# Patient Record
Sex: Female | Born: 1985
Health system: Southern US, Community
[De-identification: ages and names within clinical notes are randomized; demographics above are authoritative.]

## PROBLEM LIST (undated history)

## (undated) DIAGNOSIS — F419 Anxiety disorder, unspecified: Secondary | ICD-10-CM

---

## 2019-10-16 ENCOUNTER — Encounter (HOSPITAL_COMMUNITY): Payer: Self-pay

## 2019-10-16 ENCOUNTER — Emergency Department (HOSPITAL_COMMUNITY)
Admission: EM | Admit: 2019-10-16 | Discharge: 2019-10-17 | Disposition: A | Discharge status: Home / Self Care | Payer: Self-pay | Attending: Emergency Medicine | Admitting: Emergency Medicine

## 2019-10-16 DIAGNOSIS — S61452A Open bite of left hand, initial encounter: Secondary | ICD-10-CM | POA: Insufficient documentation

## 2019-10-16 DIAGNOSIS — W540XXA Bitten by dog, initial encounter: Secondary | ICD-10-CM | POA: Insufficient documentation

## 2019-10-16 DIAGNOSIS — Z91040 Latex allergy status: Secondary | ICD-10-CM | POA: Insufficient documentation

## 2019-10-16 DIAGNOSIS — F17200 Nicotine dependence, unspecified, uncomplicated: Secondary | ICD-10-CM | POA: Insufficient documentation

## 2019-10-16 MED ORDER — METRONIDAZOLE 500 MG PO TABS
500.0000 mg | ORAL_TABLET | Freq: Once | ORAL | Status: AC
  Administered 2019-10-17: 500 mg via ORAL
  Filled 2019-10-16: qty 1

## 2019-10-16 MED ORDER — MORPHINE SULFATE (PF) 4 MG/ML IV SOLN
4.0000 mg | Freq: Once | INTRAVENOUS | Status: AC
  Administered 2019-10-17: 4 mg via INTRAVENOUS
  Filled 2019-10-16: qty 1

## 2019-10-16 MED ORDER — DOXYCYCLINE HYCLATE 100 MG PO TABS
100.0000 mg | ORAL_TABLET | Freq: Once | ORAL | Status: AC
  Administered 2019-10-17: 100 mg via ORAL
  Filled 2019-10-16: qty 1

## 2019-10-16 NOTE — ED Provider Notes (Signed)
[facility] EMERGENCY DEPARTMENT Provider Note   CSN: [id number] Arrival date & time: 10/16/19  2329     History Chief Complaint  Patient presents with  . Anxiety  . Animal Bite    Christine Morton is a 34 y.o. female.  The history is provided by the patient and medical records.  Anxiety  Animal Bite   34 year old female with history of severe anxiety, presenting to the ED after a dog bite.  States she moved to [place] from [place] about 1 year ago and is living in a new apartment.  States tonight the fire alarm went off and her service animal seem to be trying to guide her out of the building but latched onto her thumb and accidentally bit her.  Dog was not agitated and was not acting aggressively.  States she has had this service animal for quite some time.  His vaccinations just lapsed in the past few weeks.  States she did try to take him to the vet, however there were multiple people in the lobby and due to her anxiety she had to leave.  States all vaccinations prior to that are current.  Her tetanus is up-to-date.  Dog is being taken into custody by animal control for monitoring.  No past medical history on file.  There are no problems to display for this patient.    OB History   No obstetric history on file.     No family history on file.  Social History   Tobacco Use  . Smoking status: Current Every Day Smoker    Packs/day: 2.00  Vaping Use  . Vaping Use: Never used  Substance Use Topics  . Alcohol use: Not Currently  . Drug use: Not on file    Home Medications Prior to Admission medications   Not on File    Allergies    Latex, Shellfish allergy, and Penicillins  Review of Systems   Review of Systems  Skin: Positive for wound.  All other systems reviewed and are negative.   Physical Exam Updated Vital Signs BP 115/87   Ht 5\' 1"  (1.549 m)   Wt 74.8 kg   LMP  (LMP Unknown) Comment: on depo   BMI 31.18 kg/m    Physical Exam Vitals and nursing note reviewed.  Constitutional:      Appearance: She is well-developed.  HENT:     Head: Normocephalic and atraumatic.  Eyes:     Conjunctiva/sclera: Conjunctivae normal.     Pupils: Pupils are equal, round, and reactive to light.  Cardiovascular:     Rate and Rhythm: Normal rate and regular rhythm.     Heart sounds: Normal heart sounds.  Pulmonary:     Effort: Pulmonary effort is normal.     Breath sounds: Normal breath sounds.  Abdominal:     General: Bowel sounds are normal.     Palpations: Abdomen is soft.  Musculoskeletal:        General: Normal range of motion.     Cervical back: Normal range of motion.     Comments: Left hand with several puncture wounds noted to left thumb, there is small, very superficial laceration noted to the IP joint of left thumb along the dorsal aspect, bleeding is well controlled, exam is difficult due to patient cooperation/anxiety but she appears to have normal range of motion of all fingers  Skin:    General: Skin is warm and dry.  Neurological:     Mental Status:  She is alert and oriented to person, place, and time.  Psychiatric:        Mood and Affect: Mood is anxious.     Comments: Extremely anxious, intermittently sobbing throughout entire exam     ED Results / Procedures / Treatments   Labs (all labs ordered are listed, but only abnormal results are displayed) Labs Reviewed - No data to display  EKG None  Radiology DG Hand Complete Left  Result Date: 10/17/2019 CLINICAL DATA:  Dog bite, multiple puncture wound EXAM: LEFT HAND - COMPLETE 3+ VIEW COMPARISON:  None. FINDINGS: There is no evidence of fracture or dislocation. There is no evidence of arthropathy or other focal bone abnormality. Soft tissues are unremarkable. IMPRESSION: Negative. Electronically Signed   By: Helyn Numbers MD   On: 10/17/2019 00:26    Procedures Procedures (including critical care time)  Medications Ordered in  ED Medications  morphine 4 MG/ML injection 4 mg (4 mg Intravenous Given 10/17/19 0012)  doxycycline (VIBRA-TABS) tablet 100 mg (100 mg Oral Given 10/17/19 0011)  metroNIDAZOLE (FLAGYL) tablet 500 mg (500 mg Oral Given 10/17/19 0011)    ED Course  I have reviewed the triage vital signs and the nursing notes.  Pertinent labs & imaging results that were available during my care of the patient were reviewed by me and considered in my medical decision making (see chart for details).    MDM Rules/Calculators/A&P  34 year old female presenting to the ED with dog bite to left hand from service dog after fire alarm went off in their apartment building.  She has a history of severe anxiety and is extremely anxious here in the ED, sobbing intermittently.  She does have multiple puncture wounds to the left thumb and a superficial laceration along the IP joint dorsal surface.  Bleeding is well controlled.  Her tetanus is up-to-date.  States dog is due for updated shots, but otherwise has always been up-to-date.  Dog apparently is being quarantined by animal control.  Will plan for x-ray of the hand, wound care, and dose of antibiotics, will be given Doxy and Flagyl as she does have penicillin allergy.  X-rays negative.  Wound cleansed and dressed.  Plan to discharge home with continued antibiotics and wound care instructions.  As dog is quarantined, will hold off on rabies vaccination series at this time unless any signs or symptoms suggestive of rabies from the animal.  Patient acknowledged understanding and agrees with plan of care.  She will follow-up with her primary care doctor.  She may return here for any new or acute changes.  Final Clinical Impression(s) / ED Diagnoses Final diagnoses:  Dog bite, initial encounter    Rx / DC Orders ED Discharge Orders         Ordered    doxycycline (VIBRAMYCIN) 100 MG capsule  2 times daily        10/17/19 0157    metroNIDAZOLE (FLAGYL) 500 MG tablet  2  times daily        10/17/19 0157    HYDROcodone-acetaminophen (NORCO/VICODIN) 5-325 MG tablet  Every 4 hours PRN        10/17/19 0157           Garlon Hatchet, PA-C 10/17/19 0224    Marily Memos, MD 10/17/19 [phone]

## 2019-10-16 NOTE — ED Triage Notes (Addendum)
Pt BIB EMS [place] , with c/o dog bite to left hand, multiple puncture wounds distal and proximal , by pt's service dog, dog is due now for follow vaccinations.   Pt has been unmedicated for extreme anxiety , EMS gave versed 5mg  IM and 50 mcg fentanyl 30 minutes ago   20 L. AC, VSS , ST, hemorrhage controlled with simple bandages, GCS 15 , 12 on seen d/t hyperventilation.

## 2019-10-17 ENCOUNTER — Other Ambulatory Visit (HOSPITAL_COMMUNITY): Payer: Self-pay | Admitting: Emergency Medicine

## 2019-10-17 ENCOUNTER — Emergency Department (HOSPITAL_COMMUNITY): Payer: Self-pay

## 2019-10-17 ENCOUNTER — Telehealth: Payer: Self-pay | Admitting: *Deleted

## 2019-10-17 MED ORDER — DOXYCYCLINE HYCLATE 100 MG PO CAPS: 100.0000 mg | ORAL_CAPSULE | Freq: Two times a day (BID) | ORAL | 0 refills | Status: DC

## 2019-10-17 MED ORDER — METRONIDAZOLE 500 MG PO TABS: 500.0000 mg | ORAL_TABLET | Freq: Two times a day (BID) | ORAL | 0 refills | Status: DC

## 2019-10-17 MED ORDER — HYDROCODONE-ACETAMINOPHEN 5-325 MG PO TABS: 1.0000 | ORAL_TABLET | ORAL | 0 refills | Status: DC | PRN

## 2019-10-17 MED FILL — DOXYCYCLINE HYCLATE 100 MG: 100 | 10 days supply | Qty: 20 | Fill #0

## 2019-10-17 MED FILL — metroNIDAZOLE 500 MG TABS: 500 | 10 days supply | Qty: 20 | Fill #0

## 2019-10-17 MED FILL — HYDROCODON-APAP 5-325: 5-325 | 2 days supply | Qty: 12 | Fill #0

## 2019-10-17 NOTE — Discharge Instructions (Signed)
Keep wound clean with soap and warm water daily. Take the prescribed medication as directed.  Make sure to finish the whole course of antibiotics. Dog will need to be monitored for at least 10 days to ensure no signs of rabies.  If so, you will need to get vaccinated for this. Follow-up with your primary care doctor. Return to the ED for new or worsening symptoms.

## 2019-10-17 NOTE — Telephone Encounter (Signed)
Pharmacy called regarding reasoning for printed Rx (narcotic).  RNCM reviewed chart and did not find documentation, so sent message to PA.  Will update pharmacy when information come available.

## 2020-01-06 ENCOUNTER — Encounter (HOSPITAL_COMMUNITY): Payer: Self-pay | Admitting: Emergency Medicine

## 2020-01-06 ENCOUNTER — Emergency Department (HOSPITAL_COMMUNITY)
Admission: EM | Admit: 2020-01-06 | Discharge: 2020-01-06 | Disposition: A | Discharge status: Home / Self Care | Payer: Medicaid Other | Attending: Emergency Medicine | Admitting: Emergency Medicine

## 2020-01-06 ENCOUNTER — Emergency Department (HOSPITAL_COMMUNITY): Payer: Medicaid Other

## 2020-01-06 ENCOUNTER — Other Ambulatory Visit: Payer: Self-pay

## 2020-01-06 DIAGNOSIS — R112 Nausea with vomiting, unspecified: Secondary | ICD-10-CM | POA: Diagnosis not present

## 2020-01-06 DIAGNOSIS — R109 Unspecified abdominal pain: Secondary | ICD-10-CM | POA: Insufficient documentation

## 2020-01-06 DIAGNOSIS — Z20822 Contact with and (suspected) exposure to covid-19: Secondary | ICD-10-CM | POA: Diagnosis not present

## 2020-01-06 DIAGNOSIS — R000 Tachycardia, unspecified: Secondary | ICD-10-CM | POA: Insufficient documentation

## 2020-01-06 DIAGNOSIS — Z91040 Latex allergy status: Secondary | ICD-10-CM | POA: Insufficient documentation

## 2020-01-06 DIAGNOSIS — L0501 Pilonidal cyst with abscess: Secondary | ICD-10-CM | POA: Insufficient documentation

## 2020-01-06 DIAGNOSIS — F17200 Nicotine dependence, unspecified, uncomplicated: Secondary | ICD-10-CM | POA: Diagnosis not present

## 2020-01-06 DIAGNOSIS — L0291 Cutaneous abscess, unspecified: Secondary | ICD-10-CM

## 2020-01-06 DIAGNOSIS — L0591 Pilonidal cyst without abscess: Secondary | ICD-10-CM | POA: Diagnosis present

## 2020-01-06 HISTORY — DX: Anxiety disorder, unspecified: F41.9

## 2020-01-06 LAB — CBC WITH DIFFERENTIAL/PLATELET
Abs Immature Granulocytes: 0.03 10*3/uL (ref 0.00–0.07)
Basophils Absolute: 0 10*3/uL (ref 0.0–0.1)
Basophils Relative: 0 %
Eosinophils Absolute: 0.1 10*3/uL (ref 0.0–0.5)
Eosinophils Relative: 1 %
HCT: 41.5 % (ref 36.0–46.0)
Hemoglobin: 13.7 g/dL (ref 12.0–15.0)
Immature Granulocytes: 0 %
Lymphocytes Relative: 28 %
Lymphs Abs: 2.8 10*3/uL (ref 0.7–4.0)
MCH: 29.2 pg (ref 26.0–34.0)
MCHC: 33 g/dL (ref 30.0–36.0)
MCV: 88.5 fL (ref 80.0–100.0)
Monocytes Absolute: 0.7 10*3/uL (ref 0.1–1.0)
Monocytes Relative: 7 %
Neutro Abs: 6.6 10*3/uL (ref 1.7–7.7)
Neutrophils Relative %: 64 %
Platelets: 484 10*3/uL — ABNORMAL HIGH (ref 150–400)
RBC: 4.69 MIL/uL (ref 3.87–5.11)
RDW: 12.1 % (ref 11.5–15.5)
WBC: 10.2 10*3/uL (ref 4.0–10.5)
nRBC: 0 % (ref 0.0–0.2)

## 2020-01-06 LAB — RESP PANEL BY RT-PCR (FLU A&B, COVID) ARPGX2
Influenza A by PCR: NEGATIVE
Influenza B by PCR: NEGATIVE
SARS Coronavirus 2 by RT PCR: NEGATIVE

## 2020-01-06 LAB — COMPREHENSIVE METABOLIC PANEL
ALT: 13 U/L (ref 0–44)
AST: 15 U/L (ref 15–41)
Albumin: 4 g/dL (ref 3.5–5.0)
Alkaline Phosphatase: 74 U/L (ref 38–126)
Anion gap: 11 (ref 5–15)
BUN: 6 mg/dL (ref 6–20)
CO2: 23 mmol/L (ref 22–32)
Calcium: 9.3 mg/dL (ref 8.9–10.3)
Chloride: 101 mmol/L (ref 98–111)
Creatinine, Ser: 0.92 mg/dL (ref 0.44–1.00)
GFR, Estimated: >60 mL/min (ref >60)
Glucose, Bld: 139 mg/dL — ABNORMAL HIGH (ref 70–99)
Potassium: 3.2 mmol/L — ABNORMAL LOW (ref 3.5–5.1)
Sodium: 135 mmol/L (ref 135–145)
Total Bilirubin: 1.2 mg/dL (ref 0.3–1.2)
Total Protein: 8.4 g/dL — ABNORMAL HIGH (ref 6.5–8.1)

## 2020-01-06 LAB — URINALYSIS, ROUTINE W REFLEX MICROSCOPIC
Bilirubin Urine: NEGATIVE
Glucose, UA: NEGATIVE mg/dL
Ketones, ur: NEGATIVE mg/dL
Leukocytes,Ua: NEGATIVE
Nitrite: NEGATIVE
Protein, ur: NEGATIVE mg/dL
Specific Gravity, Urine: 1.015 (ref 1.005–1.030)
pH: 5 (ref 5.0–8.0)

## 2020-01-06 LAB — LACTIC ACID, PLASMA
Lactic Acid, Venous: 1.8 mmol/L (ref 0.5–1.9)
Lactic Acid, Venous: 0.7 mmol/L (ref 0.5–1.9)

## 2020-01-06 LAB — I-STAT BETA HCG BLOOD, ED (MC, WL, AP ONLY): I-stat hCG, quantitative: <5 m[IU]/mL (ref <5)

## 2020-01-06 MED ORDER — LIDOCAINE-EPINEPHRINE 1 %-1:100000 IJ SOLN
10.0000 mL | Freq: Once | INTRAMUSCULAR | Status: DC
  Filled 2020-01-06: qty 1

## 2020-01-06 MED ORDER — ONDANSETRON 4 MG PO TBDP: 4.0000 mg | ORAL_TABLET | Freq: Three times a day (TID) | ORAL | 0 refills | Status: DC | PRN

## 2020-01-06 MED ORDER — CLINDAMYCIN HCL 150 MG PO CAPS: 450.0000 mg | ORAL_CAPSULE | Freq: Three times a day (TID) | ORAL | 0 refills | Status: AC

## 2020-01-06 MED ORDER — IOHEXOL 300 MG/ML  SOLN
100.0000 mL | Freq: Once | INTRAMUSCULAR | Status: AC | PRN
  Administered 2020-01-06: 100 mL via INTRAVENOUS

## 2020-01-06 MED ORDER — SODIUM CHLORIDE 0.9 % IV BOLUS
1000.0000 mL | Freq: Once | INTRAVENOUS | Status: AC
  Administered 2020-01-06: 1000 mL via INTRAVENOUS

## 2020-01-06 MED ORDER — ONDANSETRON HCL 4 MG/2ML IJ SOLN
4.0000 mg | Freq: Once | INTRAMUSCULAR | Status: AC
  Administered 2020-01-06: 4 mg via INTRAVENOUS
  Filled 2020-01-06: qty 2

## 2020-01-06 MED ORDER — KETOROLAC TROMETHAMINE 30 MG/ML IJ SOLN
15.0000 mg | Freq: Once | INTRAMUSCULAR | Status: AC
  Administered 2020-01-06: 15 mg via INTRAVENOUS
  Filled 2020-01-06: qty 1

## 2020-01-06 MED ORDER — ACETAMINOPHEN 500 MG PO TABS
1000.0000 mg | ORAL_TABLET | Freq: Once | ORAL | Status: AC
  Administered 2020-01-06: 1000 mg via ORAL
  Filled 2020-01-06: qty 2

## 2020-01-06 MED ORDER — POTASSIUM CHLORIDE CRYS ER 20 MEQ PO TBCR
40.0000 meq | EXTENDED_RELEASE_TABLET | Freq: Once | ORAL | Status: AC
  Administered 2020-01-06: 40 meq via ORAL
  Filled 2020-01-06: qty 2

## 2020-01-06 NOTE — ED Triage Notes (Signed)
C/o abscess to buttocks x 3 days with fever and chills.  HR and respirations elevated.

## 2020-01-06 NOTE — ED Notes (Signed)
Christine Morton(mom) would like an update ASAP [phone]

## 2020-01-06 NOTE — Discharge Instructions (Signed)
I prescribed you 2 medications.  First medication is called clindamycin.  This is a strong antibiotic.  You are going to take this 3 times a day for the next 7 days.  Do not stop taking this early.  Only take it as prescribed.  I am also prescribing you a medication called Zofran.  You can take this for worsening nausea and vomiting.  Only take this if you are experiencing nausea and vomiting that you cannot control or is causing you to become dehydrated.  If you develop fevers, chills, or continued nausea and vomiting you cannot control, please return to the emergency department for reevaluation.  It was a pleasure to meet you.

## 2020-01-06 NOTE — ED Provider Notes (Signed)
[facility] EMERGENCY DEPARTMENT Provider Note   CSN: [id number] Arrival date & time: 01/06/20  1302     History Chief Complaint  Patient presents with  . Abscess    Christine Morton is a 35 y.o. female.  HPI Pt is a 35 year old female who presents emergency department due to an abscess.  Patient notes pain and swelling overlying her coccyx that began about 4 days ago.  She reports associated fevers and chills.  Also complains of intermittent nausea and vomiting.  Notes a history of similar symptoms in the past but never of the severity or in this region.  No chest pain, shortness of breath, urinary changes.    Past Medical History:  Diagnosis Date  . Anxiety     There are no problems to display for this patient.   History reviewed. No pertinent surgical history.   OB History   No obstetric history on file.     No family history on file.  Social History   Tobacco Use  . Smoking status: Current Every Day Smoker    Packs/day: 2.00  Vaping Use  . Vaping Use: Never used  Substance Use Topics  . Alcohol use: Not Currently    Home Medications Prior to Admission medications   Medication Sig Start Date End Date Taking? Authorizing Provider  doxycycline (VIBRAMYCIN) 100 MG capsule Take 1 capsule (100 mg total) by mouth 2 (two) times daily. 10/17/19   Garlon Hatchet, PA-C  HYDROcodone-acetaminophen (NORCO/VICODIN) 5-325 MG tablet Take 1 tablet by mouth every 4 (four) hours as needed. 10/17/19   Garlon Hatchet, PA-C  metroNIDAZOLE (FLAGYL) 500 MG tablet Take 1 tablet (500 mg total) by mouth 2 (two) times daily. 10/17/19   Garlon Hatchet, PA-C    Allergies    Latex, Shellfish allergy, and Penicillins  Review of Systems   Review of Systems  All other systems reviewed and are negative. Ten systems reviewed and are negative for acute change, except as noted in the HPI.   Physical Exam Updated Vital Signs BP (!) 113/59 (BP Location: Left Arm)    Pulse (!) 107   Temp 99.1 F (37.3 C) (Oral)   Resp 18   SpO2 100%   Physical Exam Vitals and nursing note reviewed.  Constitutional:      General: She is not in acute distress.    Appearance: Normal appearance. She is obese. She is not ill-appearing, toxic-appearing or diaphoretic.     Comments: Well-developed adult female.  Lying in the semi-Fowlers position.  Writhing around on stretcher.  HENT:     Head: Normocephalic and atraumatic.     Right Ear: External ear normal.     Left Ear: External ear normal.     Nose: Nose normal.     Mouth/Throat:     Mouth: Mucous membranes are moist.     Pharynx: Oropharynx is clear. No oropharyngeal exudate or posterior oropharyngeal erythema.  Eyes:     Extraocular Movements: Extraocular movements intact.  Cardiovascular:     Rate and Rhythm: Regular rhythm. Tachycardia present.     Pulses: Normal pulses.     Heart sounds: Normal heart sounds. No murmur heard. No friction rub. No gallop.   Pulmonary:     Effort: Pulmonary effort is normal. No respiratory distress.     Breath sounds: Normal breath sounds. No stridor. No wheezing, rhonchi or rales.  Abdominal:     General: Abdomen is flat.     Palpations: Abdomen is  soft.     Tenderness: There is abdominal tenderness.     Comments: Diffuse tenderness noted along the lower abdomen with deep palpation.  No upper abdominal tenderness.  Musculoskeletal:        General: Normal range of motion.     Cervical back: Normal range of motion and neck supple. No tenderness.  Skin:    General: Skin is warm and dry.     Findings: Abscess present.     Comments: 3 to 4 cm of induration noted along the coccyx with overlying fluctuance.  No active drainage.  Exquisite tenderness overlying the site.  Small amount of surrounding cellulitis.  Consistent with abscess.  Neurological:     General: No focal deficit present.     Mental Status: She is alert and oriented to person, place, and time.  Psychiatric:         Mood and Affect: Mood normal.        Behavior: Behavior normal.    ED Results / Procedures / Treatments   Labs (all labs ordered are listed, but only abnormal results are displayed) Labs Reviewed  COMPREHENSIVE METABOLIC PANEL - Abnormal; Notable for the following components:      Result Value   Potassium 3.2 (*)    Glucose, Bld 139 (*)    Total Protein 8.4 (*)    All other components within normal limits  CBC WITH DIFFERENTIAL/PLATELET - Abnormal; Notable for the following components:   Platelets 484 (*)    All other components within normal limits  URINALYSIS, ROUTINE W REFLEX MICROSCOPIC - Abnormal; Notable for the following components:   APPearance HAZY (*)    Hgb urine dipstick MODERATE (*)    Bacteria, UA RARE (*)    All other components within normal limits  RESP PANEL BY RT-PCR (FLU A&B, COVID) ARPGX2  LACTIC ACID, PLASMA  LACTIC ACID, PLASMA  I-STAT BETA HCG BLOOD, ED ([facility], [facility], [facility] ONLY)    EKG EKG Interpretation  Date/Time:  Sunday January 06 2020 13:26:52 EST Ventricular Rate:  157 PR Interval:    QRS Duration: 66 QT Interval:  302 QTC Calculation: 488 R Axis:   68 Text Interpretation: Sinus tachycardia ST & T wave abnormality, consider inferior ischemia Abnormal ECG no acute STEMI Confirmed by Madalyn Rob [phone]) on 01/06/2020 3:16:58 PM   Radiology CT ABDOMEN PELVIS W CONTRAST  Result Date: 01/06/2020 CLINICAL DATA:  35 year old female with history of acute nonlocalized abdominal pain. Abscess in the buttock region for the past 3 days. Fever and chills. EXAM: CT ABDOMEN AND PELVIS WITH CONTRAST TECHNIQUE: Multidetector CT imaging of the abdomen and pelvis was performed using the standard protocol following bolus administration of intravenous contrast. CONTRAST:  184mL OMNIPAQUE IOHEXOL 300 MG/ML  SOLN COMPARISON:  No priors. FINDINGS: Lower chest: Unremarkable. Hepatobiliary: No suspicious cystic or solid hepatic lesions. No intra or extrahepatic biliary  ductal dilatation. Gallbladder is normal in appearance. Pancreas: No pancreatic mass. No pancreatic ductal dilatation. No pancreatic or peripancreatic fluid collections or inflammatory changes. Spleen: Unremarkable. Adrenals/Urinary Tract: Bilateral kidneys and bilateral adrenal glands are normal in appearance. No hydroureteronephrosis. Urinary bladder is nearly decompressed, but otherwise unremarkable in appearance. Stomach/Bowel: The appearance of the stomach is normal. No pathologic dilatation of small bowel or colon. Normal appendix. Vascular/Lymphatic: No significant atherosclerotic disease, aneurysm or dissection noted in the abdominal or pelvic vasculature. No lymphadenopathy noted in the abdomen or pelvis. Reproductive: No significant atherosclerotic disease, aneurysm or dissection noted in the abdominal or pelvic vasculature. No  lymphadenopathy noted in the abdomen or pelvis. Other: No significant volume of ascites.  No pneumoperitoneum. Musculoskeletal: Extensive phlegmonous inflammation is noted in the buttock region in near the midline where there is also a more well-defined centrally low-attenuation peripherally rim enhancing collection measuring approximately 2.2 x 2.2 x 2.8 cm (axial image 63 of series 3 and sagittal image 125 of series 7, concerning for a small abscess. There are no aggressive appearing lytic or blastic lesions noted in the visualized portions of the skeleton. IMPRESSION: 1. Small abscess with surrounding phlegmonous inflammation in the midline of the upper gluteal region, as above. 2. No other acute findings are noted elsewhere in the abdomen or pelvis. Electronically Signed   By: Trudie Reed M.D.   On: 01/06/2020 18:25    Procedures .Marland KitchenIncision and Drainage  Date/Time: 01/06/2020 8:15 PM Performed by: Placido Sou, PA-C Authorized by: Placido Sou, PA-C   Consent:    Consent obtained:  Verbal   Consent given by:  Patient   Risks discussed:  Bleeding,  incomplete drainage, pain and damage to other organs   Alternatives discussed:  No treatment Universal protocol:    Procedure explained and questions answered to patient or proxy's satisfaction: yes     Relevant documents present and verified: yes     Test results available : yes     Imaging studies available: yes     Required blood products, implants, devices, and special equipment available: yes     Site/side marked: yes     Immediately prior to procedure, a time out was called: yes     Patient identity confirmed:  Verbally with patient Location:    Type:  Abscess   Size:  4 cm   Location:  Anogenital   Anogenital location:  Pilonidal Pre-procedure details:    Skin preparation:  Chlorhexidine with alcohol Anesthesia:    Anesthesia method:  Local infiltration   Local anesthetic:  Lidocaine 1% WITH epi and lidocaine 2% WITH epi Procedure type:    Complexity:  Complex Procedure details:    Ultrasound guidance: no     Needle aspiration: no     Incision types:  Single straight   Incision depth:  Subcutaneous   Scalpel blade:  11   Wound management:  Probed and deloculated and extensive cleaning   Drainage:  Purulent and bloody   Drainage amount:  Moderate   Wound treatment:  Wound left open   Packing materials:  None Post-procedure details:    Procedure completion:  Tolerated well, no immediate complications    Medications Ordered in ED Medications  lidocaine-EPINEPHrine (XYLOCAINE W/EPI) 1 %-1:100000 (with pres) injection 10 mL (has no administration in time range)  sodium chloride 0.9 % bolus 1,000 mL (0 mLs Intravenous Stopped 01/06/20 1756)  ondansetron (ZOFRAN) injection 4 mg (4 mg Intravenous Given 01/06/20 1553)  acetaminophen (TYLENOL) tablet 1,000 mg (1,000 mg Oral Given 01/06/20 1555)  potassium chloride SA (KLOR-CON) CR tablet 40 mEq (40 mEq Oral Given 01/06/20 1555)  iohexol (OMNIPAQUE) 300 MG/ML solution 100 mL (100 mLs Intravenous Contrast Given 01/06/20 1732)   ondansetron (ZOFRAN) injection 4 mg (4 mg Intravenous Given 01/06/20 1938)  ketorolac (TORADOL) 30 MG/ML injection 15 mg (15 mg Intravenous Given 01/06/20 1940)    ED Course  I have reviewed the triage vital signs and the nursing notes.  Pertinent labs & imaging results that were available during my care of the patient were reviewed by me and considered in my medical decision making (see chart for  details).  Clinical Course as of 01/06/20 2016  Sun Jan 06, 2020  1830 CT ABDOMEN PELVIS W CONTRAST IMPRESSION: 1. Small abscess with surrounding phlegmonous inflammation in the midline of the upper gluteal region, as above. 2. No other acute findings are noted elsewhere in the abdomen or pelvis. [LJ]    Clinical Course User Index [LJ] Placido Sou, PA-C   MDM Rules/Calculators/A&P                          Patient is a 35 year old female who presents the emergency department with what appears to be a pilonidal abscess.  Patient had a mildly elevated temperature at 99.3 F and was tachycardic in triage.  Her tachycardia has since resolved.  She has had multiple doses of analgesics as well as antiemetics.  CBC without leukocytosis.  Mildly decreased potassium.  This was repleted with Klor-Con.  No elevation in lactic acid x2.  Given her systemic complaints, I obtained a COVID-19 test which was negative.  CT scan of the abdomen and pelvis obtained with findings as noted below which was personally reviewed and interpreted:  IMPRESSION: 1. Small abscess with surrounding phlegmonous inflammation in the midline of the upper gluteal region, as above. 2. No other acute findings are noted elsewhere in the abdomen or pelvis.  I performed an I&D on the region.  Please see my procedure note above.  We will discharge on a course of clindamycin as well as additional Zofran for nausea.  Discussed return precautions.  Her questions were answered and she was amicable at the time of discharge.  Her vital signs  are stable.  Final Clinical Impression(s) / ED Diagnoses Final diagnoses:  Abscess    Rx / DC Orders ED Discharge Orders         Ordered    clindamycin (CLEOCIN) 150 MG capsule  3 times daily        01/06/20 2021    ondansetron (ZOFRAN ODT) 4 MG disintegrating tablet  Every 8 hours PRN        01/06/20 2021           Placido Sou, PA-C 01/06/20 2024    Milagros Loll, MD 01/11/20 1234

## 2020-01-06 NOTE — ED Notes (Signed)
Pt to CT

## 2020-01-06 NOTE — ED Notes (Signed)
Pt returned to room from CT scan

## 2020-03-25 ENCOUNTER — Encounter (HOSPITAL_COMMUNITY)
Admission: EM | Disposition: A | Discharge status: Home / Self Care | Payer: Self-pay | Source: Home / Self Care | Attending: Orthopedic Surgery

## 2020-03-25 ENCOUNTER — Encounter (HOSPITAL_COMMUNITY): Payer: Self-pay

## 2020-03-25 ENCOUNTER — Inpatient Hospital Stay (HOSPITAL_COMMUNITY)
Admission: EM | Admit: 2020-03-25 | Discharge: 2020-03-28 | DRG: 517 | Disposition: A | Discharge status: Home / Self Care | Payer: Medicaid Other | Attending: Orthopedic Surgery | Admitting: Orthopedic Surgery

## 2020-03-25 ENCOUNTER — Emergency Department (HOSPITAL_COMMUNITY): Payer: Medicaid Other

## 2020-03-25 ENCOUNTER — Other Ambulatory Visit: Payer: Self-pay

## 2020-03-25 DIAGNOSIS — F17210 Nicotine dependence, cigarettes, uncomplicated: Secondary | ICD-10-CM | POA: Diagnosis present

## 2020-03-25 DIAGNOSIS — Z880 Allergy status to penicillin: Secondary | ICD-10-CM

## 2020-03-25 DIAGNOSIS — Z20822 Contact with and (suspected) exposure to covid-19: Secondary | ICD-10-CM | POA: Diagnosis present

## 2020-03-25 DIAGNOSIS — S61451A Open bite of right hand, initial encounter: Secondary | ICD-10-CM

## 2020-03-25 DIAGNOSIS — S62652B Nondisplaced fracture of medial phalanx of right middle finger, initial encounter for open fracture: Principal | ICD-10-CM | POA: Diagnosis present

## 2020-03-25 DIAGNOSIS — F419 Anxiety disorder, unspecified: Secondary | ICD-10-CM | POA: Diagnosis present

## 2020-03-25 DIAGNOSIS — Z91040 Latex allergy status: Secondary | ICD-10-CM

## 2020-03-25 DIAGNOSIS — Z79899 Other long term (current) drug therapy: Secondary | ICD-10-CM

## 2020-03-25 DIAGNOSIS — W540XXA Bitten by dog, initial encounter: Secondary | ICD-10-CM | POA: Diagnosis present

## 2020-03-25 DIAGNOSIS — Z91013 Allergy to seafood: Secondary | ICD-10-CM

## 2020-03-25 HISTORY — PX: I & D EXTREMITY: SHX5045

## 2020-03-25 LAB — CBC WITH DIFFERENTIAL/PLATELET
Abs Immature Granulocytes: 0.01 10*3/uL (ref 0.00–0.07)
Basophils Absolute: 0 10*3/uL (ref 0.0–0.1)
Basophils Relative: 0 %
Eosinophils Absolute: 0 10*3/uL (ref 0.0–0.5)
Eosinophils Relative: 1 %
HCT: 38.5 % (ref 36.0–46.0)
Hemoglobin: 12.3 g/dL (ref 12.0–15.0)
Immature Granulocytes: 0 %
Lymphocytes Relative: 35 %
Lymphs Abs: 2.6 10*3/uL (ref 0.7–4.0)
MCH: 28.3 pg (ref 26.0–34.0)
MCHC: 31.9 g/dL (ref 30.0–36.0)
MCV: 88.5 fL (ref 80.0–100.0)
Monocytes Absolute: 0.5 10*3/uL (ref 0.1–1.0)
Monocytes Relative: 7 %
Neutro Abs: 4.2 10*3/uL (ref 1.7–7.7)
Neutrophils Relative %: 57 %
Platelets: 307 10*3/uL (ref 150–400)
RBC: 4.35 MIL/uL (ref 3.87–5.11)
RDW: 11.9 % (ref 11.5–15.5)
WBC: 7.4 10*3/uL (ref 4.0–10.5)
nRBC: 0 % (ref 0.0–0.2)

## 2020-03-25 LAB — BASIC METABOLIC PANEL
Anion gap: 9 (ref 5–15)
BUN: 5 mg/dL — ABNORMAL LOW (ref 6–20)
CO2: 25 mmol/L (ref 22–32)
Calcium: 9.5 mg/dL (ref 8.9–10.3)
Chloride: 106 mmol/L (ref 98–111)
Creatinine, Ser: 0.78 mg/dL (ref 0.44–1.00)
GFR, Estimated: >60 mL/min (ref >60)
Glucose, Bld: 88 mg/dL (ref 70–99)
Potassium: 3.4 mmol/L — ABNORMAL LOW (ref 3.5–5.1)
Sodium: 140 mmol/L (ref 135–145)

## 2020-03-25 LAB — RESP PANEL BY RT-PCR (FLU A&B, COVID) ARPGX2
Influenza A by PCR: NEGATIVE
Influenza B by PCR: NEGATIVE
SARS Coronavirus 2 by RT PCR: NEGATIVE

## 2020-03-25 LAB — I-STAT BETA HCG BLOOD, ED (MC, WL, AP ONLY): I-stat hCG, quantitative: <5 m[IU]/mL (ref <5)

## 2020-03-25 SURGERY — IRRIGATION AND DEBRIDEMENT EXTREMITY
Anesthesia: General | Laterality: Right

## 2020-03-25 MED ORDER — MIDAZOLAM HCL 2 MG/2ML IJ SOLN
INTRAMUSCULAR | Status: AC
  Filled 2020-03-25: qty 2

## 2020-03-25 MED ORDER — FENTANYL CITRATE (PF) 100 MCG/2ML IJ SOLN
25.0000 ug | Freq: Once | INTRAMUSCULAR | Status: AC
Start: 2020-03-25 — End: 2020-03-25
  Administered 2020-03-25: 25 ug via INTRAVENOUS
  Filled 2020-03-25: qty 2

## 2020-03-25 MED ORDER — BUPIVACAINE HCL (PF) 0.5 % IJ SOLN
INTRAMUSCULAR | Status: AC
  Filled 2020-03-25: qty 30

## 2020-03-25 MED ORDER — PROPOFOL 10 MG/ML IV BOLUS
INTRAVENOUS | Status: AC
  Filled 2020-03-25: qty 20

## 2020-03-25 MED ORDER — DOXYCYCLINE HYCLATE 100 MG PO TABS
100.0000 mg | ORAL_TABLET | Freq: Once | ORAL | Status: AC
  Administered 2020-03-25: 100 mg via ORAL
  Filled 2020-03-25: qty 1

## 2020-03-25 MED ORDER — BUPIVACAINE HCL (PF) 0.25 % IJ SOLN
INTRAMUSCULAR | Status: AC
  Filled 2020-03-25: qty 30

## 2020-03-25 MED ORDER — FENTANYL CITRATE (PF) 250 MCG/5ML IJ SOLN
INTRAMUSCULAR | Status: AC
  Filled 2020-03-25: qty 5

## 2020-03-25 MED ORDER — CLINDAMYCIN PHOSPHATE 600 MG/50ML IV SOLN
600.0000 mg | Freq: Once | INTRAVENOUS | Status: AC
  Administered 2020-03-25: 600 mg via INTRAVENOUS
  Filled 2020-03-25: qty 50

## 2020-03-25 MED ORDER — OXYCODONE-ACETAMINOPHEN 5-325 MG PO TABS
1.0000 | ORAL_TABLET | Freq: Once | ORAL | Status: AC
  Administered 2020-03-25: 1 via ORAL
  Filled 2020-03-25: qty 1

## 2020-03-25 SURGICAL SUPPLY — 44 items
BNDG COHESIVE 4X5 TAN STRL (GAUZE/BANDAGES/DRESSINGS) ×2 IMPLANT
BNDG CONFORM 4 STRL LF (GAUZE/BANDAGES/DRESSINGS) ×2 IMPLANT
BNDG ELASTIC 4X5.8 VLCR STR LF (GAUZE/BANDAGES/DRESSINGS) ×2 IMPLANT
BNDG ESMARK 4X9 LF (GAUZE/BANDAGES/DRESSINGS) ×2 IMPLANT
BNDG GAUZE ELAST 4 BULKY (GAUZE/BANDAGES/DRESSINGS) ×2 IMPLANT
CORD BIPOLAR FORCEPS 12FT (ELECTRODE) IMPLANT
COVER SURGICAL LIGHT HANDLE (MISCELLANEOUS) ×2 IMPLANT
COVER WAND RF STERILE (DRAPES) IMPLANT
CUFF TOURN SGL QUICK 18X4 (TOURNIQUET CUFF) ×2 IMPLANT
DRAIN PENROSE 0.25X18 (DRAIN) IMPLANT
DRSG EMULSION OIL 3X16 NADH (GAUZE/BANDAGES/DRESSINGS) ×2 IMPLANT
DRSG EMULSION OIL 3X3 NADH (GAUZE/BANDAGES/DRESSINGS) ×2 IMPLANT
DRSG PAD ABDOMINAL 8X10 ST (GAUZE/BANDAGES/DRESSINGS) IMPLANT
ELECT REM PT RETURN 15FT ADLT (MISCELLANEOUS) ×2 IMPLANT
GAUZE 4X4 16PLY RFD (DISPOSABLE) ×2 IMPLANT
GAUZE SPONGE 4X4 12PLY STRL (GAUZE/BANDAGES/DRESSINGS) ×2 IMPLANT
GAUZE XEROFORM 1X8 LF (GAUZE/BANDAGES/DRESSINGS) ×2 IMPLANT
GLOVE SURG ENC MOIS LTX SZ8 (GLOVE) ×2 IMPLANT
GOWN STRL REUS W/TWL XL LVL3 (GOWN DISPOSABLE) ×2 IMPLANT
KIT BASIN OR (CUSTOM PROCEDURE TRAY) ×2 IMPLANT
KIT TURNOVER KIT A (KITS) ×2 IMPLANT
LOOP VESSEL MAXI BLUE (MISCELLANEOUS) ×2 IMPLANT
MANIFOLD NEPTUNE II (INSTRUMENTS) ×2 IMPLANT
PACK ORTHO EXTREMITY (CUSTOM PROCEDURE TRAY) ×2 IMPLANT
PENCIL SMOKE EVACUATOR (MISCELLANEOUS) IMPLANT
PROTECTOR NERVE ULNAR (MISCELLANEOUS) ×2 IMPLANT
SOL PREP POV-IOD 4OZ 10% (MISCELLANEOUS) IMPLANT
SOL PREP PROV IODINE SCRUB 4OZ (MISCELLANEOUS) IMPLANT
SPLINT FINGER 5.25 BULB (SOFTGOODS) IMPLANT
SUT PROLENE 3 0 PS 2 (SUTURE) IMPLANT
SUT PROLENE 4 0 PS 2 18 (SUTURE) IMPLANT
SUT PROLENE 5 0 P 3 (SUTURE) ×2 IMPLANT
SUT VIC AB 1 CT1 27 (SUTURE)
SUT VIC AB 1 CT1 27XBRD ANTBC (SUTURE) IMPLANT
SUT VIC AB 2-0 CT1 27 (SUTURE)
SUT VIC AB 2-0 CT1 27XBRD (SUTURE) IMPLANT
SWAB COLLECTION DEVICE MRSA (MISCELLANEOUS) IMPLANT
SWAB CULTURE ESWAB REG 1ML (MISCELLANEOUS) IMPLANT
SYR 20ML LL LF (SYRINGE) ×2 IMPLANT
SYR CONTROL 10ML LL (SYRINGE) IMPLANT
TOWEL OR 17X26 10 PK STRL BLUE (TOWEL DISPOSABLE) ×2 IMPLANT
TRAY PREP A LATEX SAFE STRL (SET/KITS/TRAYS/PACK) ×2 IMPLANT
TUBING UROLOGY SET (TUBING) ×2 IMPLANT
YANKAUER SUCT BULB TIP NO VENT (SUCTIONS) ×2 IMPLANT

## 2020-03-25 NOTE — ED Triage Notes (Signed)
Pt arrives EMS from home after dog bite to right middle finger.. Puncture wound. Abrasions on left hand after falling while attempting to break up dogs. Anxious in triage.

## 2020-03-25 NOTE — ED Notes (Signed)
Right hand soaking in sterile water as ordered.

## 2020-03-25 NOTE — ED Provider Notes (Cosign Needed Addendum)
[facility] Provider Note   CSN: [id number] Arrival date & time: 03/25/20  1928     History Chief Complaint  Patient presents with  . Animal Bite    Christine Morton is a 35 y.o. female who presents to ED with a chief complaint of dog bite.  States that someone had came into her home and her dog became aggressive.  She tried to hold the dog back and ended up sustaining a dog bite to her right middle finger.  Reports that she fell and has had abrasions to her left hand and right knee as well.  She tried to clean the area out but had significant bleeding.  Denies any numbness, weakness.  States that her dogs are up-to-date on their vaccinations. This occurred around noon today.  HPI     Past Medical History:  Diagnosis Date  . Anxiety     There are no problems to display for this patient.   History reviewed. No pertinent surgical history.   OB History   No obstetric history on file.     No family history on file.  Social History   Tobacco Use  . Smoking status: Current Every Day Smoker    Packs/day: 2.00  . Smokeless tobacco: Never Used  Vaping Use  . Vaping Use: Never used  Substance Use Topics  . Alcohol use: Not Currently    Home Medications Prior to Admission medications   Medication Sig Start Date End Date Taking? Authorizing Provider  doxycycline (VIBRAMYCIN) 100 MG capsule Take 1 capsule (100 mg total) by mouth 2 (two) times daily. 10/17/19   Garlon Hatchet, PA-C  HYDROcodone-acetaminophen (NORCO/VICODIN) 5-325 MG tablet Take 1 tablet by mouth every 4 (four) hours as needed. 10/17/19   Garlon Hatchet, PA-C  metroNIDAZOLE (FLAGYL) 500 MG tablet Take 1 tablet (500 mg total) by mouth 2 (two) times daily. 10/17/19   Garlon Hatchet, PA-C  ondansetron (ZOFRAN ODT) 4 MG disintegrating tablet Take 1 tablet (4 mg total) by mouth every 8 (eight) hours as needed for nausea or vomiting. 01/06/20   Placido Sou, PA-C     Allergies    Latex, Shellfish allergy, and Penicillins  Review of Systems   Review of Systems  Constitutional: Negative for chills and fever.  Skin: Positive for wound.  Neurological: Negative for weakness and numbness.    Physical Exam Updated Vital Signs BP 132/81   Pulse (!) 109   Temp 98.9 F (37.2 C) (Oral)   Resp (!) 30   Ht 5\' 1"  (1.549 m)   Wt 74.8 kg   SpO2 100%   BMI 31.18 kg/m   Physical Exam Vitals and nursing note reviewed.  Constitutional:      General: She is not in acute distress.    Appearance: She is well-developed. She is not diaphoretic.     Comments: Anxious, tearful.  HENT:     Head: Normocephalic and atraumatic.  Eyes:     General: No scleral icterus.    Conjunctiva/sclera: Conjunctivae normal.  Pulmonary:     Effort: Pulmonary effort is normal. No respiratory distress.  Musculoskeletal:     Cervical back: Normal range of motion.  Skin:    Findings: Wound present. No rash.     Comments: Puncture wound noted to palmar aspect of right third digit near the middle phalanx and a puncture wound noted to the dorsal aspect of this digit near the DIP joint.  Reports pain with range of motion  of the digit. Normal sensation to light touch of all digits.  When attempting passive range of motion, patient resists any type of movement against mine.  She is not allowing me to passively range this digit. Patient unwilling to have me examine depth of wound.  Neurological:     Mental Status: She is alert.     ED Results / Procedures / Treatments   Labs (all labs ordered are listed, but only abnormal results are displayed) Labs Reviewed  BASIC METABOLIC PANEL - Abnormal; Notable for the following components:      Result Value   Potassium 3.4 (*)    BUN 5 (*)    All other components within normal limits  RESP PANEL BY RT-PCR (FLU A&B, COVID) ARPGX2  CBC WITH DIFFERENTIAL/PLATELET  I-STAT BETA HCG BLOOD, ED ([facility], [facility], [facility] ONLY)     EKG None  Radiology DG Finger Middle Right  Result Date: 03/25/2020 CLINICAL DATA:  Dog bite middle finger EXAM: RIGHT MIDDLE FINGER 2+V COMPARISON:  None. FINDINGS: Fracture noted through the distal aspect of the right middle finger middle phalanx. No subluxation or dislocation. No radiopaque foreign bodies. Soft tissue swelling noted. IMPRESSION: Fracture through the distal aspect of the right middle finger middle phalanx. Electronically Signed   By: Charlett Nose M.D.   On: 03/25/2020 20:58    Procedures Procedures   Medications Ordered in ED Medications  clindamycin (CLEOCIN) IVPB 600 mg (has no administration in time range)  doxycycline (VIBRA-TABS) tablet 100 mg (has no administration in time range)  fentaNYL (SUBLIMAZE) injection 25 mcg (has no administration in time range)  oxyCODONE-acetaminophen (PERCOCET/ROXICET) 5-325 MG per tablet 1 tablet (1 tablet Oral Given 03/25/20 2145)    ED Course  I have reviewed the triage vital signs and the nursing notes.  Pertinent labs & imaging results that were available during my care of the patient were reviewed by me and considered in my medical decision making (see chart for details).  Clinical Course as of 03/25/20 2334  Tue Mar 25, 2020  2136 Spoke to Dr. Amanda Pea, on-call hand surgery.  Recommends IV antibiotics, Covid testing and basic labs as this will require washout in the ER due to high risk. [HK]  2234 I have reminded nurse to obtain Covid swab. [HK]    Clinical Course User Index [HK] Dietrich Pates, PA-C   MDM Rules/Calculators/A&P                          35 year old female presenting to the ED with a chief complaint of dog bite.  States that she was trying to contain her dog after someone came into her home.  She sustained a dog bite to her right middle finger.  States that her dog is up-to-date on vaccinations.  She has 2 puncture wounds noted on either side of the right third digit.  I am unable to passively move the  finger although she is resisting any type of movement from me.  Patient is extremely anxious and tearful and I have told her to practice deep breathing exercises several times to get a proper exam.  She denies any numbness and appears to have normal sensation in the finger.  Pulses are intact bilaterally.  X-ray shows fracture of the distal aspect of the middle phalanx of the right third digit.  Per Dr. Cheree Ditto my recommendations will require washout in the OR due to open fracture in the setting of animal bite.  I  have ordered IV antibiotics, clindamycin and doxycycline after speaking to the pharmacist as patient is allergic to penicillins.  Will obtain basic lab work and Covid testing prior to taking to the OR.  Patient does note that this happened around noon today.  All imaging, if done today, including plain films, CT scans, and ultrasounds, independently reviewed by me, and interpretations confirmed via formal radiology reads.  Portions of this note were generated with Scientist, clinical (histocompatibility and immunogenetics). Dictation errors may occur despite best attempts at proofreading.  Final Clinical Impression(s) / ED Diagnoses Final diagnoses:  Dog bite of right hand, initial encounter  Open nondisplaced fracture of middle phalanx of right middle finger, initial encounter    Rx / DC Orders ED Discharge Orders    None         Dietrich Pates, PA-C 03/25/20 2341    Pollyann Savoy, MD 03/26/20 1416

## 2020-03-26 ENCOUNTER — Emergency Department (HOSPITAL_COMMUNITY): Payer: Medicaid Other | Admitting: Certified Registered"

## 2020-03-26 ENCOUNTER — Encounter (HOSPITAL_COMMUNITY): Payer: Self-pay | Admitting: Anesthesiology

## 2020-03-26 DIAGNOSIS — F419 Anxiety disorder, unspecified: Secondary | ICD-10-CM | POA: Diagnosis present

## 2020-03-26 DIAGNOSIS — Z79899 Other long term (current) drug therapy: Secondary | ICD-10-CM | POA: Diagnosis not present

## 2020-03-26 DIAGNOSIS — Z20822 Contact with and (suspected) exposure to covid-19: Secondary | ICD-10-CM | POA: Diagnosis present

## 2020-03-26 DIAGNOSIS — W540XXA Bitten by dog, initial encounter: Secondary | ICD-10-CM | POA: Diagnosis not present

## 2020-03-26 DIAGNOSIS — F17210 Nicotine dependence, cigarettes, uncomplicated: Secondary | ICD-10-CM | POA: Diagnosis present

## 2020-03-26 DIAGNOSIS — Z91040 Latex allergy status: Secondary | ICD-10-CM | POA: Diagnosis not present

## 2020-03-26 DIAGNOSIS — Z91013 Allergy to seafood: Secondary | ICD-10-CM | POA: Diagnosis not present

## 2020-03-26 DIAGNOSIS — S62652B Nondisplaced fracture of medial phalanx of right middle finger, initial encounter for open fracture: Secondary | ICD-10-CM | POA: Diagnosis present

## 2020-03-26 DIAGNOSIS — Z880 Allergy status to penicillin: Secondary | ICD-10-CM | POA: Diagnosis not present

## 2020-03-26 LAB — CBC
HCT: 34.2 % — ABNORMAL LOW (ref 36.0–46.0)
Hemoglobin: 10.8 g/dL — ABNORMAL LOW (ref 12.0–15.0)
MCH: 28.1 pg (ref 26.0–34.0)
MCHC: 31.6 g/dL (ref 30.0–36.0)
MCV: 89.1 fL (ref 80.0–100.0)
Platelets: 241 10*3/uL (ref 150–400)
RBC: 3.84 MIL/uL — ABNORMAL LOW (ref 3.87–5.11)
RDW: 11.9 % (ref 11.5–15.5)
WBC: 5.4 10*3/uL (ref 4.0–10.5)
nRBC: 0 % (ref 0.0–0.2)

## 2020-03-26 MED ORDER — OXYCODONE HCL 5 MG/5ML PO SOLN: 5.0000 mg | Freq: Once | ORAL | Status: AC | PRN

## 2020-03-26 MED ORDER — FENTANYL CITRATE (PF) 100 MCG/2ML IJ SOLN
INTRAMUSCULAR | Status: DC | PRN
  Administered 2020-03-26 (×2): 50 ug via INTRAVENOUS

## 2020-03-26 MED ORDER — FAMOTIDINE 20 MG PO TABS: 20.0000 mg | ORAL_TABLET | Freq: Two times a day (BID) | ORAL | Status: DC | PRN

## 2020-03-26 MED ORDER — HYDROMORPHONE HCL 1 MG/ML IJ SOLN
0.5000 mg | INTRAMUSCULAR | Status: DC | PRN
  Administered 2020-03-27 (×2): 1 mg via INTRAVENOUS
  Filled 2020-03-26 (×2): qty 1

## 2020-03-26 MED ORDER — FENTANYL CITRATE (PF) 250 MCG/5ML IJ SOLN
INTRAMUSCULAR | Status: DC | PRN
  Administered 2020-03-26: 50 ug via INTRAVENOUS
  Administered 2020-03-26: 100 ug via INTRAVENOUS
  Administered 2020-03-26 (×2): 50 ug via INTRAVENOUS

## 2020-03-26 MED ORDER — DOCUSATE SODIUM 100 MG PO CAPS
100.0000 mg | ORAL_CAPSULE | Freq: Two times a day (BID) | ORAL | Status: DC
  Administered 2020-03-26 – 2020-03-28 (×5): 100 mg via ORAL
  Filled 2020-03-26 (×5): qty 1

## 2020-03-26 MED ORDER — CIPROFLOXACIN IN D5W 400 MG/200ML IV SOLN
INTRAVENOUS | Status: DC | PRN
  Administered 2020-03-26: 400 mg via INTRAVENOUS

## 2020-03-26 MED ORDER — OXYCODONE HCL 5 MG PO TABS
ORAL_TABLET | ORAL | Status: AC
  Filled 2020-03-26: qty 1

## 2020-03-26 MED ORDER — ALPRAZOLAM 0.5 MG PO TABS
0.5000 mg | ORAL_TABLET | Freq: Four times a day (QID) | ORAL | Status: DC | PRN
  Administered 2020-03-26 – 2020-03-28 (×6): 0.5 mg via ORAL
  Filled 2020-03-26 (×7): qty 1

## 2020-03-26 MED ORDER — ADULT MULTIVITAMIN W/MINERALS CH
1.0000 | ORAL_TABLET | Freq: Every day | ORAL | Status: DC
  Administered 2020-03-26 – 2020-03-28 (×3): 1 via ORAL
  Filled 2020-03-26 (×3): qty 1

## 2020-03-26 MED ORDER — LIDOCAINE 2% (20 MG/ML) 5 ML SYRINGE
INTRAMUSCULAR | Status: DC | PRN
  Administered 2020-03-26: 40 mg via INTRAVENOUS

## 2020-03-26 MED ORDER — HYDROMORPHONE HCL 1 MG/ML IJ SOLN
INTRAMUSCULAR | Status: AC
  Filled 2020-03-26: qty 1

## 2020-03-26 MED ORDER — OXYCODONE HCL 5 MG PO TABS
5.0000 mg | ORAL_TABLET | Freq: Once | ORAL | Status: AC | PRN
  Administered 2020-03-26: 5 mg via ORAL

## 2020-03-26 MED ORDER — ONDANSETRON HCL 4 MG PO TABS
4.0000 mg | ORAL_TABLET | Freq: Four times a day (QID) | ORAL | Status: DC | PRN
  Administered 2020-03-26: 4 mg via ORAL
  Filled 2020-03-26: qty 1

## 2020-03-26 MED ORDER — METHOCARBAMOL 500 MG IVPB - SIMPLE MED
500.0000 mg | Freq: Four times a day (QID) | INTRAVENOUS | Status: DC | PRN
  Administered 2020-03-26: 500 mg via INTRAVENOUS
  Filled 2020-03-26: qty 50

## 2020-03-26 MED ORDER — ONDANSETRON HCL 4 MG/2ML IJ SOLN
INTRAMUSCULAR | Status: DC | PRN
  Administered 2020-03-26: 4 mg via INTRAVENOUS

## 2020-03-26 MED ORDER — ONDANSETRON HCL 4 MG/2ML IJ SOLN
4.0000 mg | Freq: Four times a day (QID) | INTRAMUSCULAR | Status: DC | PRN
  Administered 2020-03-26 – 2020-03-28 (×4): 4 mg via INTRAVENOUS
  Filled 2020-03-26 (×4): qty 2

## 2020-03-26 MED ORDER — CLINDAMYCIN PHOSPHATE 600 MG/50ML IV SOLN
600.0000 mg | Freq: Three times a day (TID) | INTRAVENOUS | Status: DC
  Administered 2020-03-26 – 2020-03-27 (×4): 600 mg via INTRAVENOUS
  Filled 2020-03-26 (×5): qty 50

## 2020-03-26 MED ORDER — METHOCARBAMOL 500 MG IVPB - SIMPLE MED
INTRAVENOUS | Status: AC
  Filled 2020-03-26: qty 50

## 2020-03-26 MED ORDER — PROMETHAZINE HCL 12.5 MG RE SUPP
12.5000 mg | Freq: Four times a day (QID) | RECTAL | Status: DC | PRN
  Filled 2020-03-26 (×4): qty 1

## 2020-03-26 MED ORDER — ONDANSETRON HCL 4 MG/2ML IJ SOLN: 4.0000 mg | Freq: Once | INTRAMUSCULAR | Status: DC | PRN

## 2020-03-26 MED ORDER — ACETAMINOPHEN 500 MG PO TABS
1000.0000 mg | ORAL_TABLET | Freq: Four times a day (QID) | ORAL | Status: AC
  Administered 2020-03-26 – 2020-03-27 (×4): 1000 mg via ORAL
  Filled 2020-03-26 (×4): qty 2

## 2020-03-26 MED ORDER — KETOROLAC TROMETHAMINE 15 MG/ML IJ SOLN
15.0000 mg | Freq: Four times a day (QID) | INTRAMUSCULAR | Status: AC
  Administered 2020-03-26 – 2020-03-27 (×6): 15 mg via INTRAVENOUS
  Filled 2020-03-26 (×6): qty 1

## 2020-03-26 MED ORDER — OXYCODONE HCL 5 MG PO TABS
10.0000 mg | ORAL_TABLET | ORAL | Status: DC | PRN
Start: 2020-03-26 — End: 2020-03-28
  Administered 2020-03-26 – 2020-03-27 (×5): 15 mg via ORAL
  Administered 2020-03-27: 10 mg via ORAL
  Administered 2020-03-27: 15 mg via ORAL
  Administered 2020-03-27: 10 mg via ORAL
  Administered 2020-03-27 – 2020-03-28 (×4): 15 mg via ORAL
  Filled 2020-03-26: qty 3
  Filled 2020-03-26: qty 2
  Filled 2020-03-26: qty 3
  Filled 2020-03-26: qty 2
  Filled 2020-03-26 (×8): qty 3

## 2020-03-26 MED ORDER — HYDROMORPHONE HCL 1 MG/ML IJ SOLN
0.2500 mg | INTRAMUSCULAR | Status: DC | PRN
  Administered 2020-03-26 (×4): 0.5 mg via INTRAVENOUS

## 2020-03-26 MED ORDER — LACTATED RINGERS IV SOLN: INTRAVENOUS | Status: DC

## 2020-03-26 MED ORDER — CIPROFLOXACIN IN D5W 400 MG/200ML IV SOLN
400.0000 mg | Freq: Two times a day (BID) | INTRAVENOUS | Status: DC
  Administered 2020-03-26 – 2020-03-28 (×4): 400 mg via INTRAVENOUS
  Filled 2020-03-26 (×4): qty 200

## 2020-03-26 MED ORDER — SODIUM CHLORIDE 0.9 % IR SOLN
Status: DC | PRN
  Administered 2020-03-26: 1000 mL
  Administered 2020-03-26: 3000 mL

## 2020-03-26 MED ORDER — LACTATED RINGERS IV SOLN: INTRAVENOUS | Status: DC | PRN

## 2020-03-26 MED ORDER — ACETAMINOPHEN 325 MG PO TABS
325.0000 mg | ORAL_TABLET | Freq: Four times a day (QID) | ORAL | Status: DC | PRN
Start: 2020-03-27 — End: 2020-03-28
  Administered 2020-03-28: 650 mg via ORAL
  Filled 2020-03-26: qty 2

## 2020-03-26 MED ORDER — FENTANYL CITRATE (PF) 100 MCG/2ML IJ SOLN
INTRAMUSCULAR | Status: AC
  Filled 2020-03-26: qty 2

## 2020-03-26 MED ORDER — MIDAZOLAM HCL 2 MG/2ML IJ SOLN
INTRAMUSCULAR | Status: DC | PRN
  Administered 2020-03-26 (×2): 2 mg via INTRAVENOUS

## 2020-03-26 MED ORDER — PROPOFOL 10 MG/ML IV BOLUS
INTRAVENOUS | Status: DC | PRN
  Administered 2020-03-26: 180 mg via INTRAVENOUS

## 2020-03-26 MED ORDER — CIPROFLOXACIN IN D5W 400 MG/200ML IV SOLN
INTRAVENOUS | Status: AC
  Filled 2020-03-26: qty 200

## 2020-03-26 MED ORDER — HYDROMORPHONE HCL 1 MG/ML IJ SOLN
INTRAMUSCULAR | Status: AC
  Filled 2020-03-26: qty 2

## 2020-03-26 MED ORDER — MIDAZOLAM HCL 2 MG/2ML IJ SOLN
INTRAMUSCULAR | Status: AC
  Filled 2020-03-26: qty 2

## 2020-03-26 MED ORDER — OXYCODONE HCL 5 MG PO TABS: 5.0000 mg | ORAL_TABLET | ORAL | Status: DC | PRN

## 2020-03-26 MED ORDER — METHOCARBAMOL 500 MG PO TABS
500.0000 mg | ORAL_TABLET | Freq: Four times a day (QID) | ORAL | Status: DC | PRN
  Administered 2020-03-26 – 2020-03-28 (×2): 500 mg via ORAL
  Filled 2020-03-26 (×2): qty 1

## 2020-03-26 MED ORDER — ASCORBIC ACID 500 MG PO TABS
1000.0000 mg | ORAL_TABLET | Freq: Every day | ORAL | Status: DC
  Administered 2020-03-26 – 2020-03-28 (×3): 1000 mg via ORAL
  Filled 2020-03-26 (×3): qty 2

## 2020-03-26 NOTE — Anesthesia Preprocedure Evaluation (Addendum)
Anesthesia Evaluation  Patient identified by MRN, date of birth, ID band Patient awake    Reviewed: Allergy & Precautions, NPO status , Patient's Chart, lab work & pertinent test results  Airway Mallampati: II  TM Distance: >3 FB Neck ROM: Full    Dental  (+) Missing, Dental Advisory Given, Poor Dentition, Chipped, Loose,    Pulmonary Current SmokerPatient did not abstain from smoking.,    Pulmonary exam normal breath sounds clear to auscultation       Cardiovascular negative cardio ROS Normal cardiovascular exam Rhythm:Regular Rate:Normal     Neuro/Psych Anxiety negative neurological ROS     GI/Hepatic negative GI ROS, Neg liver ROS,   Endo/Other  Obesity  Renal/GU negative Renal ROS  negative genitourinary   Musculoskeletal Dog bite right hand   Abdominal (+) + obese,   Peds  Hematology negative hematology ROS (+)   Anesthesia Other Findings   Reproductive/Obstetrics                           Anesthesia Physical Anesthesia Plan  ASA: II and emergent  Anesthesia Plan: General   Post-op Pain Management:    Induction: Intravenous  PONV Risk Score and Plan: 3 and Treatment may vary due to age or medical condition, Midazolam and Ondansetron  Airway Management Planned: LMA  Additional Equipment:   Intra-op Plan:   Post-operative Plan: Extubation in OR  Informed Consent: I have reviewed the patients History and Physical, chart, labs and discussed the procedure including the risks, benefits and alternatives for the proposed anesthesia with the patient or authorized representative who has indicated his/her understanding and acceptance.     Dental advisory given  Plan Discussed with: CRNA and Anesthesiologist  Anesthesia Plan Comments:        Anesthesia Quick Evaluation

## 2020-03-26 NOTE — Anesthesia Procedure Notes (Signed)
Procedure Name: LMA Insertion Date/Time: 03/26/2020 12:30 AM Performed by: Minerva Ends, CRNA Pre-anesthesia Checklist: Patient identified, Emergency Drugs available, Suction available and Patient being monitored Patient Re-evaluated:Patient Re-evaluated prior to induction Oxygen Delivery Method: Circle System Utilized Preoxygenation: Pre-oxygenation with 100% oxygen Induction Type: IV induction Ventilation: Mask ventilation without difficulty LMA: LMA inserted and LMA with gastric port inserted LMA Size: 4.0 Number of attempts: 1 Placement Confirmation: positive ETCO2 Tube secured with: Tape Dental Injury: Teeth and Oropharynx as per pre-operative assessment  Comments: SMOOTH IV INDUCTION FOSTER LMA INSERTION AM CRNA ATRAUMATIC-- TEETH AND MOUTH AS PREOP- VERY POOR DENTITION-- MISSING MANY UPPER TEETH AND ONE TOOTH REMAINS ON LEFT SIDE- CHIPPED AND LOOSE-- POOR LOWERS MISSING AND CHIPPED-- BILAT BS

## 2020-03-26 NOTE — Progress Notes (Signed)
Chaplain engaged in an initial visit with Christine Morton.  Christine Morton shared that her dog, Christine Morton (spelling), is an emotional support dog.  Christine Morton was given to her when her son's father was tragically killed.  Being separated from her dog right now has brought up that past trauma.  Christine Morton is also dealing with her landlord who stated that she would either have to move or that she could stay but that she would need to give away the dog.  Christine Morton is currently in a place of needing to make some hard decisions.  She does not want to move but she also does not want to get rid of her dog.  She stated that a man that she did not know tried to come into her home which prompted the dog's reaction.  She tried to step in to stop the dog from hurting the man so Christine Morton would recognize he was hurting her instead.  She is saddened by how people view her dog and that she had to relinquish Christine Morton to a shelter.  She states that there is no other place her dog can go because she is originally from [place].  Most of her family still resides there.  Christine Morton also shared that she deals with anxiety and does take prescribed medicine for it.  Chaplain offered listening, presence and prayer with Christine Morton.  Chaplain will follow-up.    03/26/20 1000  Clinical Encounter Type  Visited With Patient  Visit Type Initial;Spiritual support

## 2020-03-26 NOTE — Op Note (Signed)
Operative note 03/26/2020  Dominica Severin MD  Preoperative diagnosis right hand middle finger dog bite volar and dorsal lacerations with open middle phalanx fracture  Postop diagnosis the same  Operative procedure #1 irrigation debridement open fracture right middle phalanx about the middle finger #2 flexor tenolysis tenosynovectomy extensive in nature right middle finger #3 open treatment middle phalanx fracture right middle finger #4 extensor tendon tenolysis tendon synovectomy right middle finger #5 debridement 3 dorsal wounds right middle finger  Surgeon Dominica Severin  Anesthesia General  Estimated blood loss minimal  Indications for the procedure:-Patient stained a dog bite severe nature with open fracture and loss of function to the finger.  We are planning urgent irrigation debridement.  She has an open fracture about the middle phalanx.  Please see history and physical.  Operative procedure: Patient was seen by myself and anesthesia taken to the operative theater and underwent smooth induction of general anesthesia she was prepped with Hibiclens scrub followed by Betadine scrub and paint performed by myself timeout was observed body parts well-padded and the operation commenced with excision of a transverse type laceration over the middle phalanx volar region.  Skin was excised.  Skin flaps were then elevated the patient's ulnar digital nerve was identified and swept out of harm's way the flexor sheath was encroached upon and the patient's open fracture was then accessed.  We performed irrigation debridement and excisional debridement of the open fracture including skin subtenons tissue flexor tendon sheath and the bony region.  This was a 1 x 2 cm dissection and I did use a curette to perform open treatment of the middle phalanx fracture with excision of the fracture fragment and stabilization of the bone.  The flexor underwent a tenolysis tendon synovectomy.  I made sure that all  nonviable and contaminated fluid tissue was removed.  Following this the dorsal skin was addressed with I&D about 3 dorsal skin tears followed by a more formal opening of a more aggressive bite in this region with extensor tenolysis tenosynovectomy and irrigation debridement of the bone.  Thus both flexor and extensor tenolysis tenosynovectomy was accomplished as well as open treatment of the middle phalanx fracture and an aggressive irrigation debridement.  We washed out with 4 L of saline.  The patient tolerated this well.  Following this we then performed closure of the volar wound I left the dorsal wounds open.  She was dressed sterilely with Adaptic Xeroform 4 x 4 gauze.  Given her penicillin allergy we will place her on Cipro and clindamycin.  Cipro will be 400 mg IV every 12 and the clindamycin will be 600 mg every 8 hours.  We will watch her closely.  She had excellent refill no complications.  Tourniquet time was less than half hour.  Dontaye Hur MD

## 2020-03-26 NOTE — Anesthesia Postprocedure Evaluation (Signed)
Anesthesia Post Note  Patient: Christine Morton  Procedure(s) Performed: IRRIGATION AND DEBRIDEMENT EXTREMITY (Right )     Patient location during evaluation: PACU Anesthesia Type: General Level of consciousness: awake and alert and oriented Pain management: pain level controlled Vital Signs Assessment: post-procedure vital signs reviewed and stable Respiratory status: spontaneous breathing, nonlabored ventilation, respiratory function stable and patient connected to nasal cannula oxygen Cardiovascular status: blood pressure returned to baseline and stable Postop Assessment: no apparent nausea or vomiting Anesthetic complications: no   No complications documented.  Last Vitals:  Vitals:   03/26/20 0142 03/26/20 0145  BP:  137/90  Pulse:    Resp: (!) 39 (!) 21  Temp:    SpO2:  100%    Last Pain:  Vitals:   03/26/20 0145  TempSrc:   PainSc: Asleep                 Monroe Toure A.

## 2020-03-26 NOTE — H&P (Signed)
Christine Morton is an 35 y.o. female.   Chief Complaint: Right hand dog bite about the middle finger with open fracture HPI: 35 year old female trying to break up a fight with her dog and a person.  She stuck her fingers in the mouth of her dog dog subsequently bit the middle finger and she sustained an open fracture.  She has dorsal and volar wounds.  These are quite severe.  Given these issues we can plan for admission IV antibiotics and surgical debridement.  Past Medical History:  Diagnosis Date  . Anxiety     History reviewed. No pertinent surgical history.  No family history on file. Social History:  reports that she has been smoking. She has been smoking about 2.00 packs per day. She has never used smokeless tobacco. She reports previous alcohol use. No history on file for drug use.  Allergies:  Allergies  Allergen Reactions  . Latex     Rash   . Shellfish Allergy     Throat closes   . Penicillins Rash    (Not in a hospital admission)   Results for orders placed or performed during the hospital encounter of 03/25/20 (from the past 48 hour(s))  Resp Panel by RT-PCR (Flu A&B, Covid) Nasopharyngeal Swab     Status: None   Collection Time: 03/25/20 10:10 PM   Specimen: Nasopharyngeal Swab; Nasopharyngeal(NP) swabs in vial transport medium  Result Value Ref Range   SARS Coronavirus 2 by RT PCR NEGATIVE NEGATIVE    Comment: (NOTE) SARS-CoV-2 target nucleic acids are NOT DETECTED.  The SARS-CoV-2 RNA is generally detectable in upper respiratory specimens during the acute phase of infection. The lowest concentration of SARS-CoV-2 viral copies this assay can detect is 138 copies/mL. A negative result does not preclude SARS-Cov-2 infection and should not be used as the sole basis for treatment or other patient management decisions. A negative result may occur with  improper specimen collection/handling, submission of specimen other than nasopharyngeal swab, presence of  viral mutation(s) within the areas targeted by this assay, and inadequate number of viral copies(<138 copies/mL). A negative result must be combined with clinical observations, patient history, and epidemiological information. The expected result is Negative.  Fact Sheet for Patients:  BloggerCourse.com  Fact Sheet for Healthcare Providers:  SeriousBroker.it  This test is no t yet approved or cleared by the [place] FDA and  has been authorized for detection and/or diagnosis of SARS-CoV-2 by FDA under an Emergency Use Authorization (EUA). This EUA will remain  in effect (meaning this test can be used) for the duration of the COVID-19 declaration under Section 564(b)(1) of the Act, 21 U.S.C.section 360bbb-3(b)(1), unless the authorization is terminated  or revoked sooner.       Influenza A by PCR NEGATIVE NEGATIVE   Influenza B by PCR NEGATIVE NEGATIVE    Comment: (NOTE) The Xpert Xpress SARS-CoV-2/FLU/RSV plus assay is intended as an aid in the diagnosis of influenza from Nasopharyngeal swab specimens and should not be used as a sole basis for treatment. Nasal washings and aspirates are unacceptable for Xpert Xpress SARS-CoV-2/FLU/RSV testing.  Fact Sheet for Patients: BloggerCourse.com  Fact Sheet for Healthcare Providers: SeriousBroker.it  This test is not yet approved or cleared by the [place] FDA and has been authorized for detection and/or diagnosis of SARS-CoV-2 by FDA under an Emergency Use Authorization (EUA). This EUA will remain in effect (meaning this test can be used) for the duration of the COVID-19 declaration under Section 564(b)(1) of the  Act, 21 U.S.C. section 360bbb-3(b)(1), unless the authorization is terminated or revoked.  Performed at [facility], [address], [place], [place] [address]   CBC with Differential      Status: None   Collection Time: 03/25/20 10:27 PM  Result Value Ref Range   WBC 7.4 4.0 - 10.5 K/uL   RBC 4.35 3.87 - 5.11 MIL/uL   Hemoglobin 12.3 12.0 - 15.0 g/dL   HCT 21.3 08.6 - 57.8 %   MCV 88.5 80.0 - 100.0 fL   MCH 28.3 26.0 - 34.0 pg   MCHC 31.9 30.0 - 36.0 g/dL   RDW 46.9 62.9 - 52.8 %   Platelets 307 150 - 400 K/uL   nRBC 0.0 0.0 - 0.2 %   Neutrophils Relative % 57 %   Neutro Abs 4.2 1.7 - 7.7 K/uL   Lymphocytes Relative 35 %   Lymphs Abs 2.6 0.7 - 4.0 K/uL   Monocytes Relative 7 %   Monocytes Absolute 0.5 0.1 - 1.0 K/uL   Eosinophils Relative 1 %   Eosinophils Absolute 0.0 0.0 - 0.5 K/uL   Basophils Relative 0 %   Basophils Absolute 0.0 0.0 - 0.1 K/uL   Immature Granulocytes 0 %   Abs Immature Granulocytes 0.01 0.00 - 0.07 K/uL    Comment: Performed at [facility], [address], [place], [place] [address]  Basic metabolic panel     Status: Abnormal   Collection Time: 03/25/20 10:27 PM  Result Value Ref Range   Sodium 140 135 - 145 mmol/L   Potassium 3.4 (L) 3.5 - 5.1 mmol/L   Chloride 106 98 - 111 mmol/L   CO2 25 22 - 32 mmol/L   Glucose, Bld 88 70 - 99 mg/dL    Comment: Glucose reference range applies only to samples taken after fasting for at least 8 hours.   BUN 5 (L) 6 - 20 mg/dL   Creatinine, Ser 4.01 0.44 - 1.00 mg/dL   Calcium 9.5 8.9 - 02.7 mg/dL   GFR, Estimated >25 >36 mL/min    Comment: (NOTE) Calculated using the CKD-EPI Creatinine Equation (2021)    Anion gap 9 5 - 15    Comment: Performed at [facility], [address], [place], [place] [address]  I-Stat beta hCG blood, ED     Status: None   Collection Time: 03/25/20 10:34 PM  Result Value Ref Range   I-stat hCG, quantitative <5.0 <5 mIU/mL   Comment 3            Comment:   GEST. AGE      CONC.  (mIU/mL)   <=1 WEEK        5 - 50     2 WEEKS       50 - 500     3 WEEKS       100 - 10,000     4 WEEKS     1,000 - 30,000        FEMALE AND NON-PREGNANT  FEMALE:     LESS THAN 5 mIU/mL    DG Finger Middle Right  Result Date: 03/25/2020 CLINICAL DATA:  Dog bite middle finger EXAM: RIGHT MIDDLE FINGER 2+V COMPARISON:  None. FINDINGS: Fracture noted through the distal aspect of the right middle finger middle phalanx. No subluxation or dislocation. No radiopaque foreign bodies. Soft tissue swelling noted. IMPRESSION: Fracture through the distal aspect of the right middle finger middle phalanx. Electronically Signed   By:  Charlett Nose M.D.   On: 03/25/2020 20:58    Review of Systems  Respiratory: Negative.   Cardiovascular: Negative.     Blood pressure 132/81, pulse (!) 109, temperature 98.9 F (37.2 C), temperature source Oral, resp. rate (!) 30, height 5\' 1"  (1.549 m), weight 74.8 kg, SpO2 100 %. Physical Exam  Abrasions to the right and left hands as well as swelling in the right ankle and right knee.  These not been x-rayed we will plan to x-ray these areas after her surgery.  She is sensate in the distal extremities.  Her right upper extremity has pain and dorsal and volar wounds with open fracture.  She cannot move her finger due to pain the dog bites are rather impressive in this region.  I reviewed this with her at length.  X-rays reveal an open fracture.  The patient is alert and oriented in no acute distress. The patient complains of pain in the affected upper extremity.  The patient is noted to have a normal HEENT exam. Lung fields show equal chest expansion and no shortness of breath. Abdomen exam is nontender without distention. Lower extremity examination does not show any fracture dislocation or blood clot symptoms. Pelvis is stable and the neck and back are stable and nontender. Assessment/Plan Open fracture right middle finger secondary to dog bite.  We will plan for irrigation debridement repair reconstruction.  We will plan for IV antibiotics.  We will plan for clindamycin and Cipro.  We will watch her very  closely.  We are planning surgery for your upper extremity. The risk and benefits of surgery to include risk of bleeding, infection, anesthesia,  damage to normal structures and failure of the surgery to accomplish its intended goals of relieving symptoms and restoring function have been discussed in detail. With this in mind we plan to proceed. I have specifically discussed with the patient the pre-and postoperative regime and the dos and don'ts and risk and benefits in great detail. Risk and benefits of surgery also include risk of dystrophy(CRPS), chronic nerve pain, failure of the healing process to go onto completion and other inherent risks of surgery The relavent the pathophysiology of the disease/injury process, as well as the alternatives for treatment and postoperative course of action has been discussed in great detail with the patient who desires to proceed.  We will do everything in our power to help you (the patient) restore function to the upper extremity. It is a pleasure to see this patient today.   III, MD 03/26/2020, 12:05 AM

## 2020-03-26 NOTE — Progress Notes (Signed)
Patient ID: Christine Morton, female   DOB: 1985-10-01, 35 y.o.   MRN: [id number] Patient was seen at bedside.  I discussed with the patient the surgical findings.  This was a significant canine bite with open bony fracture and exposed tendon.  Given her penicillin allergy will plan for clindamycin and Cipro.  I would like to continue elevation and IV antibiotics until Friday given the presentation and interoperative findings.  Ultimately will consider discharge on p.o. antibiotics perhaps Friday things are looking good.  At present time her pain is still severe.  We will try Toradol in conjunction with her narcotic imaging.  Lungs are clear.  Abdomen is nontender.  Lower extremity examination is benign.  Left upper extremity is neurovascularly intact.  I reviewed all issues with patient at length and the findings.  We will continue to day by day look.  She understands my concern is a late osteomyelitic infection which could develop.  Thus the reason for the aggressive debridement today and also the reason for continued antibiotics.  I want to be very careful and aggressive with this to try and avoid a poor outcome.  The patient understands this.  Gramig MD

## 2020-03-26 NOTE — TOC Initial Note (Signed)
Transition of Care [facility]) - Initial/Assessment Note   Patient Details  Name: Elisabet Gutzmer MRN: [id number] Date of Birth: [birth year]  Transition of Care [facility]) CM/SW Contact:    Sherie Don, LCSW Phone Number: 03/26/2020, 12:07 PM  Clinical Narrative: Patient is a 35 year old female who was admitted for a dog bite. Surgical debridement has been done; TOC received consult for possible HH/DME needs.  CSW met with patient to complete assessment. Per patient, she resides with her two minor children. Patient is able to afford her medications each month through her Medicaid. Patient uses Lyft to get to medical appointments. CSW discussed the possibility of discharging home with IV antibiotics. Patient agreeable to referral to [facility] if home IV antibiotics are needed. TOC to follow.  Expected Discharge Plan: Home/Self Care Barriers to Discharge: Continued Medical Work up  Patient Goals and CMS Choice Patient states their goals for this hospitalization and ongoing recovery are:: Return home  Expected Discharge Plan and Services Expected Discharge Plan: Home/Self Care In-house Referral: Clinical Social Work Living arrangements for the past 2 months: Apartment             DME Arranged: N/A DME Agency: NA  Prior Living Arrangements/Services Living arrangements for the past 2 months: Apartment Lives with:: Minor Children Patient language and need for interpreter reviewed:: Yes Do you feel safe going back to the place where you live?: Yes      Need for Family Participation in Patient Care: No (Comment) Care giver support system in place?: Yes (comment) Criminal Activity/Legal Involvement Pertinent to Current Situation/Hospitalization: No - Comment as needed  Activities of Daily Living Home Assistive Devices/Equipment: None ADL Screening (condition at time of admission) Patient's cognitive ability adequate to safely complete daily activities?: Yes Is the patient deaf or have difficulty  hearing?: No Does the patient have difficulty seeing, even when wearing glasses/contacts?: No Does the patient have difficulty concentrating, remembering, or making decisions?: No Patient able to express need for assistance with ADLs?: Yes Does the patient have difficulty dressing or bathing?: No Independently performs ADLs?: Yes (appropriate for developmental age) Does the patient have difficulty walking or climbing stairs?: No Weakness of Legs: None Weakness of Arms/Hands: None  Permission Sought/Granted Permission sought to share information with : Other (comment) Permission granted to share information with : Yes, Verbal Permission Granted Permission granted to share info w AGENCY: [facility] (if IV antibiotics are needed at discharge)  Emotional Assessment Appearance:: Appears stated age Attitude/Demeanor/Rapport: Engaged Affect (typically observed): Accepting Orientation: : Oriented to Self,Oriented to Place,Oriented to  Time,Oriented to Situation Alcohol / Substance Use: Tobacco Use Psych Involvement: No (comment)  Admission diagnosis:  Open nondisplaced fracture of middle phalanx of right middle finger, initial encounter [S62.652B] Dog bite of right hand, initial encounter [S61.451A, W54.0XXA] Dog bite [id number]] Patient Active Problem List   Diagnosis Date Noted  . Dog bite 03/26/2020   PCP:  Patient, No Pcp Per Pharmacy:   [id number] [place] [id number]: [phone]: [phone] Readmission Risk Interventions No flowsheet data found.

## 2020-03-26 NOTE — Progress Notes (Signed)
Pt arrived to floor from PACU in stable condition. Assessment and vs performed. Pt with abrasions to left hand and right knee. Rn will cover with foam dressings. Pt anxious and tearful on arrival to floor. Rn will continue to monitor.

## 2020-03-26 NOTE — Transfer of Care (Signed)
Immediate Anesthesia Transfer of Care Note  Patient: Christine Morton  Procedure(s) Performed: IRRIGATION AND DEBRIDEMENT EXTREMITY (Right )  Patient Location: PACU  Anesthesia Type:General  Level of Consciousness: awake and alert   Airway & Oxygen Therapy: Patient Spontanous Breathing and Patient connected to face mask oxygen  Post-op Assessment: Report given to RN and Post -op Vital signs reviewed and stable  Post vital signs: Reviewed and stable  Last Vitals:  Vitals Value Taken Time  BP 132/86 03/26/20 0118  Temp 36.7 C 03/26/20 0118  Pulse 93 03/26/20 0120  Resp 21 03/26/20 0120  SpO2 100 % 03/26/20 0120  Vitals shown include unvalidated device data.  Last Pain:  Vitals:   03/26/20 0008  TempSrc:   PainSc: 9       Patients Stated Pain Goal: 0 (03/25/20 1935)  Complications: No complications documented.

## 2020-03-27 LAB — CBC
HCT: 33.5 % — ABNORMAL LOW (ref 36.0–46.0)
Hemoglobin: 10.6 g/dL — ABNORMAL LOW (ref 12.0–15.0)
MCH: 28.4 pg (ref 26.0–34.0)
MCHC: 31.6 g/dL (ref 30.0–36.0)
MCV: 89.8 fL (ref 80.0–100.0)
Platelets: 240 10*3/uL (ref 150–400)
RBC: 3.73 MIL/uL — ABNORMAL LOW (ref 3.87–5.11)
RDW: 11.8 % (ref 11.5–15.5)
WBC: 4.8 10*3/uL (ref 4.0–10.5)
nRBC: 0 % (ref 0.0–0.2)

## 2020-03-27 MED ORDER — METRONIDAZOLE IN NACL 5-0.79 MG/ML-% IV SOLN
500.0000 mg | Freq: Three times a day (TID) | INTRAVENOUS | Status: DC
  Administered 2020-03-27 – 2020-03-28 (×3): 500 mg via INTRAVENOUS
  Filled 2020-03-27 (×3): qty 100

## 2020-03-27 NOTE — Progress Notes (Signed)
Patient ID: Christine Morton, female   DOB: 10/07/1985, 35 y.o.   MRN: [id number] Patient is here for follow-up evaluation.  I have reviewed her exam and performed a comprehensive dressing change.  Overall her fingers looking better.  There is no ascending cellulitis or erythema.  She status post dog bite with open fracture.  She underwent acute irrigation debridement and repair.  She has been on antibiotics appropriate to her penicillin allergy.  At present time the dressing change look very good today in terms of the soft tissue parameters and I would feel comfortable discharging her with follow-up next week tomorrow morning.  We will plan for antibiotics p.o. and continue an aggressive postop care course for her.  The patient is alert and oriented in no acute distress. The patient complains of pain in the affected upper extremity.  The patient is noted to have a normal HEENT exam. Lung fields show equal chest expansion and no shortness of breath. Abdomen exam is nontender without distention. Lower extremity examination does not show any fracture dislocation or blood clot symptoms. Pelvis is stable and the neck and back are stable and nontender.  I discussed all issues with the patient and told her family dressing changes for the weekend. Rajon Bisig MD

## 2020-03-28 LAB — CBC
HCT: 34.3 % — ABNORMAL LOW (ref 36.0–46.0)
Hemoglobin: 11 g/dL — ABNORMAL LOW (ref 12.0–15.0)
MCH: 28.1 pg (ref 26.0–34.0)
MCHC: 32.1 g/dL (ref 30.0–36.0)
MCV: 87.5 fL (ref 80.0–100.0)
Platelets: 276 10*3/uL (ref 150–400)
RBC: 3.92 MIL/uL (ref 3.87–5.11)
RDW: 11.6 % (ref 11.5–15.5)
WBC: 4.8 10*3/uL (ref 4.0–10.5)
nRBC: 0 % (ref 0.0–0.2)

## 2020-03-28 MED ORDER — CIPROFLOXACIN HCL 500 MG PO TABS: 500.0000 mg | ORAL_TABLET | Freq: Two times a day (BID) | ORAL | 0 refills | Status: AC

## 2020-03-28 MED ORDER — METRONIDAZOLE 500 MG PO TABS: 500.0000 mg | ORAL_TABLET | Freq: Three times a day (TID) | ORAL | 0 refills | Status: AC

## 2020-03-28 MED ORDER — PROMETHAZINE HCL 12.5 MG PO TABS
12.5000 mg | ORAL_TABLET | Freq: Four times a day (QID) | ORAL | 0 refills | Status: DC | PRN
Start: 2020-03-28 — End: 2020-10-29

## 2020-03-28 MED ORDER — OXYCODONE HCL 5 MG PO TABS: 5.0000 mg | ORAL_TABLET | ORAL | 0 refills | Status: AC | PRN

## 2020-03-28 NOTE — Discharge Summary (Signed)
Physician Discharge Summary  Patient ID: Christine Morton MRN: [id number] DOB/AGE: [birth year] 35 y.o.  Admit date: 03/25/2020 Discharge date:   Admission Diagnoses: dog bite Past Medical History:  Diagnosis Date  . Anxiety     Discharge Diagnoses:  Active Problems:   Dog bite   Surgeries: Procedure(s): IRRIGATION AND DEBRIDEMENT EXTREMITY on 03/26/2020    Consultants:   Discharged Condition: Improved  Hospital Course: Christine Morton is an 35 y.o. female who was admitted 03/25/2020 with a chief complaint of  Chief Complaint  Patient presents with  . Animal Bite  , and found to have a diagnosis of dog bite.  They were brought to the operating room on 03/26/2020 and underwent Procedure(s): IRRIGATION AND DEBRIDEMENT EXTREMITY.    They were given perioperative antibiotics:  Anti-infectives (From admission, onward)   Start     Dose/Rate Route Frequency Ordered Stop   03/28/20 0000  ciprofloxacin (CIPRO) 500 MG tablet        500 mg Oral 2 times daily 03/28/20 1124 04/11/20 2359   03/28/20 0000  metroNIDAZOLE (FLAGYL) 500 MG tablet        500 mg Oral 3 times daily 03/28/20 1124 04/11/20 2359   03/27/20 1200  metroNIDAZOLE (FLAGYL) IVPB 500 mg        500 mg 100 mL/hr over 60 Minutes Intravenous Every 8 hours 03/27/20 1115     03/26/20 1315  ciprofloxacin (CIPRO) IVPB 400 mg        400 mg 200 mL/hr over 60 Minutes Intravenous Every 12 hours 03/26/20 0258     03/26/20 0800  clindamycin (CLEOCIN) IVPB 600 mg  Status:  Discontinued        600 mg 100 mL/hr over 30 Minutes Intravenous Every 8 hours 03/26/20 0258 03/27/20 1115   03/26/20 0013  ciprofloxacin (CIPRO) 400 MG/200ML IVPB       Note to Pharmacy: Judeen Hammans   : cabinet override      03/26/20 0013 03/26/20 0044   03/25/20 2215  clindamycin (CLEOCIN) IVPB 600 mg        600 mg 100 mL/hr over 30 Minutes Intravenous  Once 03/25/20 2214 03/26/20 0009   03/25/20 2215  doxycycline (VIBRA-TABS) tablet 100 mg        100 mg  Oral  Once 03/25/20 2214 03/25/20 2338    .  They were given sequential compression devices, early ambulation, and  Recent vital signs:  Patient Vitals for the past 24 hrs:  BP Temp Temp src Pulse Resp SpO2  03/28/20 0509 130/73 98.4 F (36.9 C) Oral 91 20 100 %  03/27/20 2111 121/63 98.7 F (37.1 C) Oral (!) 58 14 100 %  03/27/20 1341 130/78 98.8 F (37.1 C) Oral (!) 59 16 100 %  .  Recent laboratory studies: No results found.  Discharge Medications:   Allergies as of 03/28/2020      Reactions   Penicillins Anaphylaxis   Patient reported hives and throat swelling requiring hospitalization when she was 13    Latex    Rash    Shellfish Allergy    Throat closes       Medication List    TAKE these medications   ALPRAZolam 0.5 MG tablet Commonly known as: XANAX Take 0.5 mg by mouth daily as needed for anxiety.   ciprofloxacin 500 MG tablet Commonly known as: Cipro Take 1 tablet (500 mg total) by mouth 2 (two) times daily for 14 days.   metroNIDAZOLE 500 MG tablet Commonly known as:  Flagyl Take 1 tablet (500 mg total) by mouth 3 (three) times daily for 14 days.   oxyCODONE 5 MG immediate release tablet Commonly known as: Roxicodone Take 1 tablet (5 mg total) by mouth every 4 (four) hours as needed.   promethazine 12.5 MG tablet Commonly known as: PHENERGAN Take 1 tablet (12.5 mg total) by mouth every 6 (six) hours as needed for nausea or vomiting.       Diagnostic Studies: DG Finger Middle Right  Result Date: 03/25/2020 CLINICAL DATA:  Dog bite middle finger EXAM: RIGHT MIDDLE FINGER 2+V COMPARISON:  None. FINDINGS: Fracture noted through the distal aspect of the right middle finger middle phalanx. No subluxation or dislocation. No radiopaque foreign bodies. Soft tissue swelling noted. IMPRESSION: Fracture through the distal aspect of the right middle finger middle phalanx. Electronically Signed   By: Charlett Nose M.D.   On: 03/25/2020 20:58    They benefited  maximally from their hospital stay and there were no complications.     Disposition: Discharge disposition: 01-Home or Self Care      Discharge Instructions    Call MD / Call 911   Complete by: As directed    If you experience chest pain or shortness of breath, CALL 911 and be transported to the hospital emergency room.  If you develope a fever above 101 F, pus (white drainage) or increased drainage or redness at the wound, or calf pain, call your surgeon's office.   Constipation Prevention   Complete by: As directed    Drink plenty of fluids.  Prune juice may be helpful.  You may use a stool softener, such as Colace (over the counter) 100 mg twice a day.  Use MiraLax (over the counter) for constipation as needed.   Diet - low sodium heart healthy   Complete by: As directed    Increase activity slowly as tolerated   Complete by: As directed       Follow-up Information    Dominica Severin, MD Follow up in 5 day(s).   Specialty: Orthopedic Surgery Why: Please come to Dr. Carlos Levering office Wednesday at 12 noon for your follow-up and please change your bandages daily Contact information: [address] [place] [address] [phone]              I discussed all issues with the patient today.  She is anxious to go home.  She will follow-up in my office Wednesday at 12 noon.  We will have instructed her on dressing changes daily.  We will discharge her on Flagyl and Cipro and see her back Wednesday and check on her progress.  I will also discharge her on oxycodone and Phenergan.  We had a long conversation over the phone today to make sure she is already.  She is very ready and feels comfortable with the plan of care.  She understands the need for daily dressing changes etc.  Final discharge diagnosis status post canine bite with open fractures about the affected right middle finger. Signed: Dionne Ano Arnecia Ector III 03/28/2020, 11:28 AM

## 2020-03-28 NOTE — Discharge Instructions (Signed)
Please take both of your medicines for antibiotic purposes.  Please take your pain medicine as needed.  Please change your bandage daily as instructed by Dr. Amanda Pea.  Please call for any problems.  Keep bandage clean and dry.  Call for any problems.  No smoking.  Criteria for driving a car: you should be off your pain medicine for 7-8 hours, able to drive one handed(confident), thinking clearly and feeling able in your judgement to drive. Continue elevation as it will decrease swelling.  If instructed by MD move your fingers within the confines of the bandage/splint.  Use ice if instructed by your MD. Call immediately for any sudden loss of feeling in your hand/arm or change in functional abilities of the extremity.We recommend that you to take vitamin C 1000 mg a day to promote healing. We also recommend that if you require  pain medicine that you take a stool softener to prevent constipation as most pain medicines will have constipation side effects. We recommend either Peri-Colace or Senokot and recommend that you also consider adding MiraLAX as well to prevent the constipation affects from pain medicine if you are required to use them. These medicines are over the counter and may be purchased at a local pharmacy. A cup of yogurt and a probiotic can also be helpful during the recovery process as the medicines can disrupt your intestinal environment.

## 2020-03-28 NOTE — Progress Notes (Signed)
D/C instructions given. Patient had no questions. NT or write will wheel patient out once she is dressed and her ride is here

## 2020-10-29 ENCOUNTER — Encounter (HOSPITAL_COMMUNITY): Payer: Self-pay

## 2020-10-29 ENCOUNTER — Other Ambulatory Visit: Payer: Self-pay

## 2020-10-29 ENCOUNTER — Emergency Department (HOSPITAL_COMMUNITY)
Admission: EM | Admit: 2020-10-29 | Discharge: 2020-10-30 | Disposition: A | Discharge status: Home / Self Care | Payer: Medicaid Other | Attending: Emergency Medicine | Admitting: Emergency Medicine

## 2020-10-29 DIAGNOSIS — Z20822 Contact with and (suspected) exposure to covid-19: Secondary | ICD-10-CM | POA: Diagnosis not present

## 2020-10-29 DIAGNOSIS — F22 Delusional disorders: Secondary | ICD-10-CM | POA: Insufficient documentation

## 2020-10-29 DIAGNOSIS — Z79899 Other long term (current) drug therapy: Secondary | ICD-10-CM | POA: Diagnosis not present

## 2020-10-29 DIAGNOSIS — G4700 Insomnia, unspecified: Secondary | ICD-10-CM | POA: Insufficient documentation

## 2020-10-29 DIAGNOSIS — N9489 Other specified conditions associated with female genital organs and menstrual cycle: Secondary | ICD-10-CM | POA: Diagnosis not present

## 2020-10-29 DIAGNOSIS — F32A Depression, unspecified: Secondary | ICD-10-CM | POA: Diagnosis present

## 2020-10-29 DIAGNOSIS — F17210 Nicotine dependence, cigarettes, uncomplicated: Secondary | ICD-10-CM | POA: Insufficient documentation

## 2020-10-29 DIAGNOSIS — F419 Anxiety disorder, unspecified: Secondary | ICD-10-CM

## 2020-10-29 LAB — CBC WITH DIFFERENTIAL/PLATELET
Abs Immature Granulocytes: 0.02 10*3/uL (ref 0.00–0.07)
Basophils Absolute: 0 10*3/uL (ref 0.0–0.1)
Basophils Relative: 0 %
Eosinophils Absolute: 0.1 10*3/uL (ref 0.0–0.5)
Eosinophils Relative: 2 %
HCT: 37.8 % (ref 36.0–46.0)
Hemoglobin: 12.2 g/dL (ref 12.0–15.0)
Immature Granulocytes: 0 %
Lymphocytes Relative: 43 %
Lymphs Abs: 2.9 10*3/uL (ref 0.7–4.0)
MCH: 28 pg (ref 26.0–34.0)
MCHC: 32.3 g/dL (ref 30.0–36.0)
MCV: 86.7 fL (ref 80.0–100.0)
Monocytes Absolute: 0.5 10*3/uL (ref 0.1–1.0)
Monocytes Relative: 7 %
Neutro Abs: 3.2 10*3/uL (ref 1.7–7.7)
Neutrophils Relative %: 48 %
Platelets: 349 10*3/uL (ref 150–400)
RBC: 4.36 MIL/uL (ref 3.87–5.11)
RDW: 12.6 % (ref 11.5–15.5)
WBC: 6.7 10*3/uL (ref 4.0–10.5)
nRBC: 0 % (ref 0.0–0.2)

## 2020-10-29 LAB — COMPREHENSIVE METABOLIC PANEL
ALT: 13 U/L (ref 0–44)
AST: 18 U/L (ref 15–41)
Albumin: 3.7 g/dL (ref 3.5–5.0)
Alkaline Phosphatase: 58 U/L (ref 38–126)
Anion gap: 8 (ref 5–15)
BUN: 9 mg/dL (ref 6–20)
CO2: 26 mmol/L (ref 22–32)
Calcium: 9 mg/dL (ref 8.9–10.3)
Chloride: 105 mmol/L (ref 98–111)
Creatinine, Ser: 0.73 mg/dL (ref 0.44–1.00)
GFR, Estimated: >60 mL/min (ref >60)
Glucose, Bld: 111 mg/dL — ABNORMAL HIGH (ref 70–99)
Potassium: 3.3 mmol/L — ABNORMAL LOW (ref 3.5–5.1)
Sodium: 139 mmol/L (ref 135–145)
Total Bilirubin: 0.8 mg/dL (ref 0.3–1.2)
Total Protein: 7.7 g/dL (ref 6.5–8.1)

## 2020-10-29 LAB — ACETAMINOPHEN LEVEL: Acetaminophen (Tylenol), Serum: <10 ug/mL — ABNORMAL LOW (ref 10–30)

## 2020-10-29 LAB — ETHANOL: Alcohol, Ethyl (B): <10 mg/dL (ref <10)

## 2020-10-29 LAB — SALICYLATE LEVEL: Salicylate Lvl: <7 mg/dL — ABNORMAL LOW (ref 7.0–30.0)

## 2020-10-29 LAB — I-STAT BETA HCG BLOOD, ED (MC, WL, AP ONLY): I-stat hCG, quantitative: <5 m[IU]/mL (ref <5)

## 2020-10-29 LAB — RESP PANEL BY RT-PCR (FLU A&B, COVID) ARPGX2
Influenza A by PCR: NEGATIVE
Influenza B by PCR: NEGATIVE
SARS Coronavirus 2 by RT PCR: NEGATIVE

## 2020-10-29 MED ORDER — ACETAMINOPHEN 325 MG PO TABS
650.0000 mg | ORAL_TABLET | ORAL | Status: DC | PRN
  Administered 2020-10-30: 650 mg via ORAL
  Filled 2020-10-29: qty 2

## 2020-10-29 MED ORDER — ONDANSETRON HCL 4 MG PO TABS
4.0000 mg | ORAL_TABLET | Freq: Three times a day (TID) | ORAL | Status: DC | PRN
  Administered 2020-10-30: 4 mg via ORAL
  Filled 2020-10-29 (×2): qty 1

## 2020-10-29 MED ORDER — LORAZEPAM 0.5 MG PO TABS
0.5000 mg | ORAL_TABLET | Freq: Four times a day (QID) | ORAL | Status: DC | PRN
  Administered 2020-10-30: 0.5 mg via ORAL
  Filled 2020-10-29: qty 1

## 2020-10-29 MED ORDER — ZOLPIDEM TARTRATE 5 MG PO TABS: 5.0000 mg | ORAL_TABLET | Freq: Every evening | ORAL | Status: DC | PRN

## 2020-10-29 MED ORDER — NICOTINE 21 MG/24HR TD PT24: 21.0000 mg | MEDICATED_PATCH | Freq: Every day | TRANSDERMAL | Status: DC
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
## 2020-10-29 NOTE — ED Triage Notes (Signed)
Pt states that she is sad about her kids dad dying 4 years ago and paranoid and wants to stop taking her psych medications. Pt has a bin full of medications. Pt states that she needs to speak to someone, she does not have a primary provider here in [place].

## 2020-10-30 LAB — RAPID URINE DRUG SCREEN, HOSP PERFORMED
Amphetamines: NOT DETECTED
Barbiturates: NOT DETECTED
Benzodiazepines: POSITIVE — AB
Cocaine: NOT DETECTED
Opiates: POSITIVE — AB
Tetrahydrocannabinol: POSITIVE — AB

## 2020-10-30 NOTE — ED Provider Notes (Addendum)
Emergency Medicine Observation Re-evaluation Note  Christine Morton is a 35 y.o. female, seen on rounds today.  Pt initially presented to the ED for complaints of Mental Health Problem Currently, the patient is stable.  Physical Exam  BP 123/68   Pulse (!) 52   Temp 98.2 F (36.8 C) (Oral)   Resp 16   Ht 5\' 1"  (1.549 m)   Wt 74.8 kg   SpO2 100%   BMI 31.16 kg/m  Physical Exam General: Resting, NAD Cardiac: Well perfused Lungs: Respirations even and unlabored Psych: No agitation  ED Course / MDM  EKG:   I have reviewed the labs performed to date as well as medications administered while in observation.  Recent changes in the last 24 hours include Patient presented to the ED for evaluation due to worsening depression. TTS consult placed and pending.  Plan  Current plan is for Pending TTS consult.  Christine Morton is not under involuntary commitment.  Following TTS consult, the patient was psychiatrically cleared for DC: "TTS completed. Discussed clinicals with the [facility] provider [facility], DNP) whom recommended [facility], continuous observation. Patient offered, yet declined the services recommended. Christine Morton is ok with patient being discharged home. She is psych cleared."   Christine Dow, MD 10/30/20 11/01/20    2119, MD 10/30/20 1128

## 2020-10-30 NOTE — ED Notes (Signed)
Discharge instructions and followup discussed with pt. Pt verbalizes understanding. Pt is A&Ox4 and ambulatory with a steady gait.

## 2020-10-30 NOTE — BH Assessment (Signed)
[facility] Assessment Progress Note  Per Caryn Bee, NP, this pt would benefit from admission to the Facility Based Crisis Unit, but she does not meet criteria for IVC at this time.  This write spoke to pt about admission to [facility], but she does not want to go.  Pt is psychiatrically cleared.  Discharge instructions include referral information for outpatient services at [facility].  EDP Ernie Avena, MD and pt's nurse, Yvonna Alanis, have been notified.  Doylene Canning, MA Triage Specialist [phone]

## 2020-10-30 NOTE — ED Notes (Signed)
Pt ambulatory to and from bathroom with a steady gait. Pt placed in Room 24 to speak with TTS.

## 2020-10-30 NOTE — ED Notes (Signed)
Pt is dressed. Pts belongings are in cabinet 16-18. Which includes her phone, purse, clothes

## 2020-10-30 NOTE — ED Notes (Signed)
Pt requesting nausea medication. Dr. Karene Fry made aware. Pt has PRN Zofran ordered q8hrs, last dose 0433.
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
## 2020-10-30 NOTE — Discharge Instructions (Addendum)
For your behavioral health needs you are advised to follow up with [facility] at your earliest opportunity:      [facility]      [address]      [place], [place] [address]      [phone]      They offer psychiatry/medication management, and therapy and substance use disorder treatment.  New patients are seen in their walk-in clinic.  Walk-in hours are Monday - Thursday from 8:00 am - 11:00 am for psychiatry, and Friday from 1:00 pm - 4:00 pm for therapy.  Walk-in patients are seen on a first come, first served basis, so try to arrive as early as possible for the best chance of being seen the same day.  Please note that to be eligible for services you must bring an ID or a piece of mail with your name and a [facility] address.

## 2020-10-30 NOTE — BH Assessment (Signed)
@  [id number], requested nursing (Li-Blair, RN) to place the TTS machine in the room with patient if he is available to complete this initial TTS.

## 2021-08-23 ENCOUNTER — Emergency Department (HOSPITAL_COMMUNITY)
Admission: EM | Admit: 2021-08-23 | Discharge: 2021-08-23 | Disposition: A | Discharge status: Home / Self Care | Payer: Medicaid Other | Attending: Emergency Medicine | Admitting: Emergency Medicine

## 2021-08-23 ENCOUNTER — Emergency Department (HOSPITAL_COMMUNITY): Payer: Medicaid Other

## 2021-08-23 ENCOUNTER — Encounter (HOSPITAL_COMMUNITY): Payer: Self-pay

## 2021-08-23 ENCOUNTER — Other Ambulatory Visit: Payer: Self-pay

## 2021-08-23 DIAGNOSIS — K029 Dental caries, unspecified: Secondary | ICD-10-CM | POA: Diagnosis not present

## 2021-08-23 DIAGNOSIS — R7989 Other specified abnormal findings of blood chemistry: Secondary | ICD-10-CM | POA: Diagnosis not present

## 2021-08-23 DIAGNOSIS — J029 Acute pharyngitis, unspecified: Secondary | ICD-10-CM | POA: Diagnosis not present

## 2021-08-23 DIAGNOSIS — R519 Headache, unspecified: Secondary | ICD-10-CM | POA: Insufficient documentation

## 2021-08-23 DIAGNOSIS — R0602 Shortness of breath: Secondary | ICD-10-CM | POA: Diagnosis not present

## 2021-08-23 DIAGNOSIS — Z91040 Latex allergy status: Secondary | ICD-10-CM | POA: Insufficient documentation

## 2021-08-23 DIAGNOSIS — K0889 Other specified disorders of teeth and supporting structures: Secondary | ICD-10-CM | POA: Diagnosis present

## 2021-08-23 LAB — BASIC METABOLIC PANEL WITH GFR
Anion gap: 6 (ref 5–15)
BUN: 9 mg/dL (ref 6–20)
CO2: 24 mmol/L (ref 22–32)
Calcium: 8.9 mg/dL (ref 8.9–10.3)
Chloride: 109 mmol/L (ref 98–111)
Creatinine, Ser: 1.01 mg/dL — ABNORMAL HIGH (ref 0.44–1.00)
GFR, Estimated: >60 mL/min
Glucose, Bld: 114 mg/dL — ABNORMAL HIGH (ref 70–99)
Potassium: 3.6 mmol/L (ref 3.5–5.1)
Sodium: 139 mmol/L (ref 135–145)

## 2021-08-23 LAB — CBC
HCT: 38 % (ref 36.0–46.0)
Hemoglobin: 12.2 g/dL (ref 12.0–15.0)
MCH: 29.1 pg (ref 26.0–34.0)
MCHC: 32.1 g/dL (ref 30.0–36.0)
MCV: 90.7 fL (ref 80.0–100.0)
Platelets: 344 10*3/uL (ref 150–400)
RBC: 4.19 MIL/uL (ref 3.87–5.11)
RDW: 11.9 % (ref 11.5–15.5)
WBC: 5 10*3/uL (ref 4.0–10.5)
nRBC: 0 % (ref 0.0–0.2)

## 2021-08-23 MED ORDER — CLINDAMYCIN HCL 300 MG PO CAPS
300.0000 mg | ORAL_CAPSULE | Freq: Once | ORAL | Status: AC
  Administered 2021-08-23: 300 mg via ORAL
  Filled 2021-08-23: qty 1

## 2021-08-23 MED ORDER — DEXAMETHASONE SODIUM PHOSPHATE 10 MG/ML IJ SOLN
10.0000 mg | Freq: Once | INTRAMUSCULAR | Status: AC
  Administered 2021-08-23: 10 mg via INTRAVENOUS
  Filled 2021-08-23: qty 1

## 2021-08-23 MED ORDER — DIPHENHYDRAMINE HCL 50 MG/ML IJ SOLN
25.0000 mg | Freq: Once | INTRAMUSCULAR | Status: AC
  Administered 2021-08-23: 25 mg via INTRAVENOUS
  Filled 2021-08-23: qty 1

## 2021-08-23 MED ORDER — DIPHENHYDRAMINE HCL 50 MG/ML IJ SOLN: 25.0000 mg | Freq: Once | INTRAMUSCULAR | Status: DC

## 2021-08-23 MED ORDER — CLINDAMYCIN HCL 300 MG PO CAPS: 300.0000 mg | ORAL_CAPSULE | Freq: Three times a day (TID) | ORAL | 0 refills | Status: AC

## 2021-08-23 MED ORDER — FENTANYL CITRATE PF 50 MCG/ML IJ SOSY
50.0000 ug | PREFILLED_SYRINGE | Freq: Once | INTRAMUSCULAR | Status: AC
  Administered 2021-08-23: 50 ug via INTRAVENOUS
  Filled 2021-08-23: qty 1

## 2021-08-23 MED ORDER — LIDOCAINE VISCOUS HCL 2 % MT SOLN: 15.0000 mL | OROMUCOSAL | 0 refills | Status: AC | PRN

## 2021-08-23 MED ORDER — SODIUM CHLORIDE (PF) 0.9 % IJ SOLN
INTRAMUSCULAR | Status: AC
  Filled 2021-08-23: qty 50

## 2021-08-23 MED ORDER — HYDROCODONE-ACETAMINOPHEN 5-325 MG PO TABS: 2.0000 | ORAL_TABLET | ORAL | 0 refills | Status: AC | PRN

## 2021-08-23 MED ORDER — IOHEXOL 300 MG/ML  SOLN
100.0000 mL | Freq: Once | INTRAMUSCULAR | Status: AC | PRN
  Administered 2021-08-23: 100 mL via INTRAVENOUS

## 2021-08-23 MED ORDER — DEXAMETHASONE SODIUM PHOSPHATE 10 MG/ML IJ SOLN: 10.0000 mg | Freq: Once | INTRAMUSCULAR | Status: DC

## 2021-08-23 MED ORDER — KETOROLAC TROMETHAMINE 15 MG/ML IJ SOLN
15.0000 mg | Freq: Once | INTRAMUSCULAR | Status: AC
  Administered 2021-08-23: 15 mg via INTRAVENOUS
  Filled 2021-08-23: qty 1

## 2021-08-23 MED ORDER — SODIUM CHLORIDE 0.9 % IV BOLUS
1000.0000 mL | Freq: Once | INTRAVENOUS | Status: AC
  Administered 2021-08-23: 1000 mL via INTRAVENOUS

## 2021-08-23 NOTE — ED Provider Notes (Signed)
[facility] DEPT Provider Note   CSN: [id number] Arrival date & time: 08/23/21  0909     History  Chief Complaint  Patient presents with   Shortness of Breath   Sore Throat    Christine Morton is a 36 y.o. female with medical history anxiety.  The patient presents to the ED for evaluation of facial pain.  Patient reports that at some point in the last 1 year she was seen by dentist in [place], advised that she had a dental infection however she never followed up due to financial issues.  The patient states that over the last week she has had progressively worsening right-sided dental pain.  Patient reports that last night the dental pain became so bad that she started hyperventilating and having an anxiety attack.  The patient states that she also experienced what she thought was trouble swallowing.  The patient states that this morning around 8 AM she took an amoxicillin pill that was given to her by her sister believing that she could treat her dental infection at home, patient states she has a history of penicillin allergies.  On examination the patient is speaking in full sentences, denies any trouble swallowing, denies any throat closure, denies change in phonation.  Patient denies any urticarial hives, however does complain of generalized and global itching.  Patient states that she also believes that her lips are swollen. The patient is unable to fully cooperate in the examination as she is crying in bed.   Shortness of Breath Sore Throat Associated symptoms include shortness of breath.       Home Medications Prior to Admission medications   Medication Sig Start Date End Date Taking? Authorizing Provider  clindamycin (CLEOCIN) 300 MG capsule Take 1 capsule (300 mg total) by mouth 3 (three) times daily. 08/23/21  Yes Azucena Cecil, PA-C  HYDROcodone-acetaminophen (NORCO/VICODIN) 5-325 MG tablet Take 2 tablets by mouth every 4 (four) hours as  needed. 08/23/21  Yes Azucena Cecil, PA-C  lidocaine (XYLOCAINE) 2 % solution Use as directed 15 mLs in the mouth or throat as needed for mouth pain. 08/23/21  Yes Azucena Cecil, PA-C  ALPRAZolam Duanne Moron) 0.5 MG tablet Take 0.5 mg by mouth daily as needed for anxiety. 11/28/19   [provider]      Allergies    Bee venom, Other, Penicillins, Shellfish allergy, and Latex    Review of Systems   Review of Systems  Unable to perform ROS: Acuity of condition (Level 5 caveat)  Respiratory:  Positive for shortness of breath.   All other systems reviewed and are negative.   Physical Exam Updated Vital Signs BP 129/80   Pulse 86   Temp 98.1 F (36.7 C) (Oral)   Resp 15   Ht 5\' 1"  (1.549 m)   Wt 81.6 kg   SpO2 99%   BMI 34.01 kg/m  Physical Exam Vitals and nursing note reviewed.  Constitutional:      General: She is not in acute distress.    Appearance: Normal appearance. She is not ill-appearing, toxic-appearing or diaphoretic.  HENT:     Head: Normocephalic and atraumatic.     Nose: Nose normal. No congestion.     Mouth/Throat:     Mouth: Mucous membranes are moist. No oral lesions.     Pharynx: Oropharynx is clear. Uvula midline. No uvula swelling.     Tonsils: No tonsillar exudate or tonsillar abscesses. 0 on the right. 0 on the left.  Comments: Extensive dental caries throughout patient mouth, no obvious abscess in need of drainage. Eyes:     Extraocular Movements: Extraocular movements intact.     Conjunctiva/sclera: Conjunctivae normal.     Pupils: Pupils are equal, round, and reactive to light.  Cardiovascular:     Rate and Rhythm: Normal rate and regular rhythm.  Pulmonary:     Effort: Pulmonary effort is normal.     Breath sounds: Normal breath sounds. No wheezing.  Abdominal:     General: Abdomen is flat. Bowel sounds are normal.     Palpations: Abdomen is soft.     Tenderness: There is no abdominal tenderness.  Musculoskeletal:      Cervical back: Normal range of motion and neck supple. No tenderness.  Skin:    General: Skin is warm and dry.     Capillary Refill: Capillary refill takes less than 2 seconds.  Neurological:     Mental Status: She is alert and oriented to person, place, and time.     ED Results / Procedures / Treatments   Labs (all labs ordered are listed, but only abnormal results are displayed) Labs Reviewed  BASIC METABOLIC PANEL - Abnormal; Notable for the following components:      Result Value   Glucose, Bld 114 (*)    Creatinine, Ser 1.01 (*)    All other components within normal limits  CBC    EKG EKG Interpretation  Date/Time:  Sunday August 23 2021 09:31:36 EDT Ventricular Rate:  82 PR Interval:  147 QRS Duration: 83 QT Interval:  387 QTC Calculation: 455 R Axis:   34 Text Interpretation: Sinus rhythm ST elevation, consider inferior injury No acute changes No significant change since last tracing Confirmed by Derwood Kaplan ([id number]) on 08/23/2021 9:58:27 AM  Radiology CT Maxillofacial W Contrast  Result Date: 08/23/2021 CLINICAL DATA:  Maxillary/facial abscess, assess for dental infection. EXAM: CT MAXILLOFACIAL WITH CONTRAST TECHNIQUE: Multidetector CT imaging of the maxillofacial structures was performed with intravenous contrast. Multiplanar CT image reconstructions were also generated. RADIATION DOSE REDUCTION: This exam was performed according to the departmental dose-optimization program which includes automated exposure control, adjustment of the mA and/or kV according to patient size and/or use of iterative reconstruction technique. CONTRAST:  OMNIPAQUE IOHEXOL 300 MG/ML  SOLN COMPARISON:  None Available. FINDINGS: Osseous: Diffuse cavities of the remaining teeth, the maxillary teeth are especially deeply eroded. Periapical lucency around multiple teeth including the upper right central incisor, left upper canine, and first left lower premolar. Gas along the labial aspect  of the left anterior mandible could be in the soft tissues or within the mouth. No abscess. Possible cellulitis anterior to the mandible. Orbits: Negative Sinuses: Clear Soft tissues: As above Limited intracranial: Negative IMPRESSION: Advanced dental caries with multiple periapical erosion. Cellulitis is possible along the anterior mandible, no abscess or floor of mouth swelling. Electronically Signed   By: Tiburcio Pea M.D.   On: 08/23/2021 12:17    Procedures Procedures   Medications Ordered in ED Medications  dexamethasone (DECADRON) injection 10 mg (10 mg Intravenous Given 08/23/21 1034)  diphenhydrAMINE (BENADRYL) injection 25 mg (25 mg Intravenous Given 08/23/21 1034)  ketorolac (TORADOL) 15 MG/ML injection 15 mg (15 mg Intravenous Given 08/23/21 1152)  clindamycin (CLEOCIN) capsule 300 mg (300 mg Oral Given 08/23/21 1153)  fentaNYL (SUBLIMAZE) injection 50 mcg (50 mcg Intravenous Given 08/23/21 1153)  iohexol (OMNIPAQUE) 300 MG/ML solution 100 mL (100 mLs Intravenous Contrast Given 08/23/21 1158)  sodium chloride (PF) 0.9 %  injection (  Given by Other 08/23/21 1223)  sodium chloride 0.9 % bolus 1,000 mL (1,000 mLs Intravenous Bolus 08/23/21 1230)    ED Course/ Medical Decision Making/ A&P                           Medical Decision Making Amount and/or Complexity of Data Reviewed Labs: ordered. Radiology: ordered.  Risk Prescription drug management.   36 year old female presents to ED for evaluation.  Please see HPI for further details.  On examination the patient is afebrile and nontachycardic.  Patient lung sounds clear bilaterally, nonhypoxic on room air.  Patient posterior oropharynx shows no signs of erythema, exudate.  The patient uvula is midline, she is swallowing secretions appropriately.  The patient does have extensive dental caries that her entire mouth however there is no obvious abscess in need of drainage.  The patient is handling secretions appropriately.  Patient is  writhing in bed.  Patient worked up utilizing the following labs and imaging studies interpreted by me personally: - BMP with elevated creatinine of 1.01 most likely due to hypovolemia.  Patient given 1 L normal saline - CBC unremarkable, no leukocytosis - CT maxillofacial does show advancement of caries with multiple periapical erosions, however no abscess   Patient was treated with 50 mcg fentanyl, 50 mg Toradol, 10 mg Decadron, 25 mg Benadryl, 300 mg clindamycin, 1 L normal saline.  Fentanyl and Toradol for pain, Decadron, Benadryl for suspected allergic reaction, clindamycin for dental infection.  At this time, the patient is responded well to treatment.  The patient states that she is now pain-free, speaking in full sentences.  Patient was sent home on 7-day course of clindamycin as well as a dental resource guide to follow-up with dentist in the area.  The patient was given return precautions and she voiced understanding.  The patient had all of her questions answered to her satisfaction.  The patient stable this time for discharge home.  Final Clinical Impression(s) / ED Diagnoses Final diagnoses:  Pain due to dental caries    Rx / DC Orders ED Discharge Orders          Ordered    lidocaine (XYLOCAINE) 2 % solution  As needed        08/23/21 1344    clindamycin (CLEOCIN) 300 MG capsule  3 times daily        08/23/21 1344    HYDROcodone-acetaminophen (NORCO/VICODIN) 5-325 MG tablet  Every 4 hours PRN        08/23/21 1344              Al [place], [place] 08/23/21 1345    Derwood Kaplan, MD 08/28/21 1441

## 2021-08-23 NOTE — ED Triage Notes (Signed)
Pt and family reports she has been having issues with a mouth infection. Around 0400 this morning patient began having difficulty breathing, pain in the right side of her face, pain in her right arm and pain in her throat. Pt is able to speak but does have difficulty talking due to the pain.

## 2021-08-23 NOTE — Discharge Instructions (Addendum)
Please return to the ED with any new or worsening signs or symptoms such as fevers, inability to swallow Please follow-up with dentist in the area.  Please utilize the resource guide attached to this document. Please begin taking clindamycin for the next 7 days.  You will take this 3 times daily. Please also pick up pain management control measures sent in.  These include viscous lidocaine and pain medication.  Please do not drive or operate heavy machinery under the influence of pain medication.  Please also continue taking ibuprofen at home for pain control

## 2022-01-19 IMAGING — CT CT ABD-PELV W/ CM
2 of 6 series · 14 of 46 positions shown, 16 images · IV contrast (Omni 300)
Comparison: No priors.

CLINICAL DATA: 34-year-old female with history of acute
nonlocalized abdominal pain. Abscess in the buttock region for the
past 3 days. Fever and chills.

EXAM:
CT ABDOMEN AND PELVIS WITH CONTRAST
TECHNIQUE: Multidetector CT imaging of the abdomen and pelvis was performed
using the standard protocol following bolus administration of
intravenous contrast.
CONTRAST:  100mL OMNIPAQUE IOHEXOL 300 MG/ML  SOLN

[Series 3: a/p w/ 5mm · axial · 0.98mm/px · z∈[+707,+1207]mm · 11 of 121 slices shown, 13 images]
[im 11/121  soft-tissue]
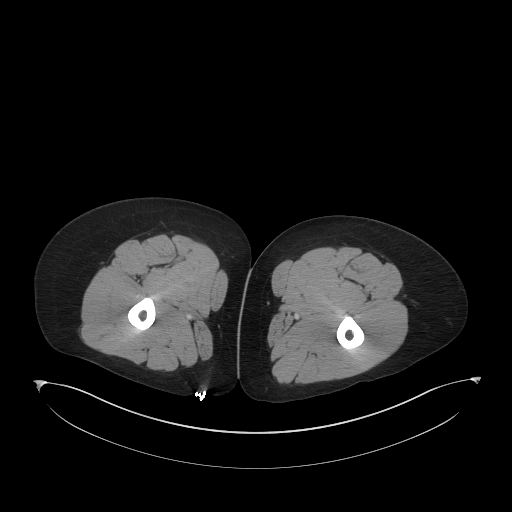
[im 11/121  bone]
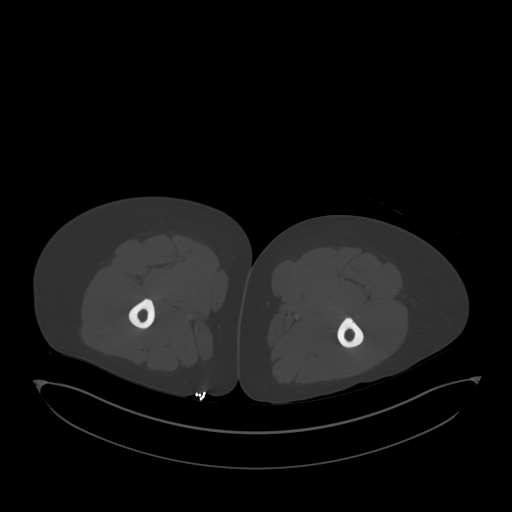
[im 21/121  soft-tissue]
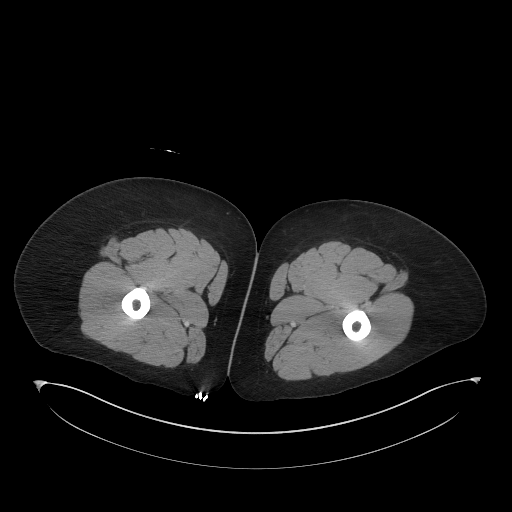
[im 31/121  soft-tissue]
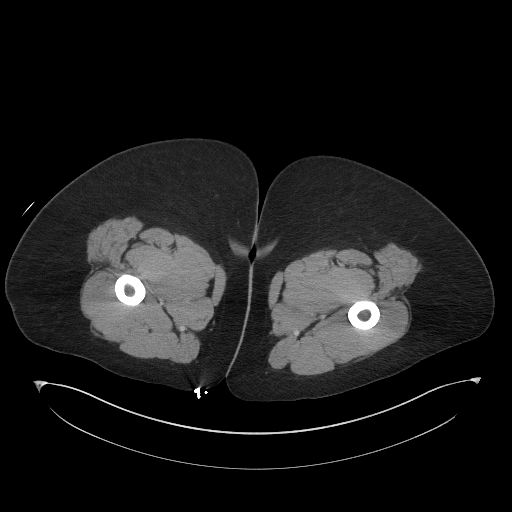
[im 41/121  soft-tissue]
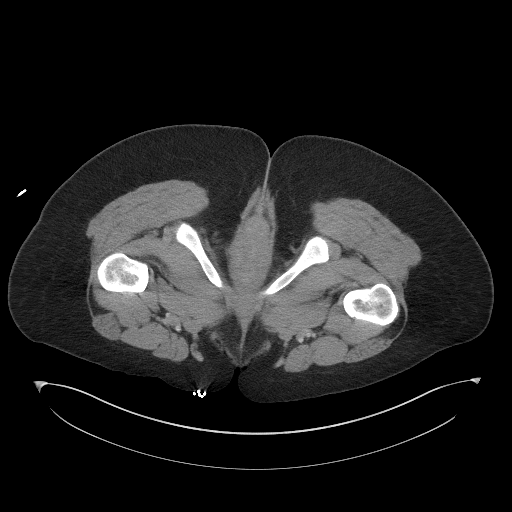
[im 51/121  soft-tissue]
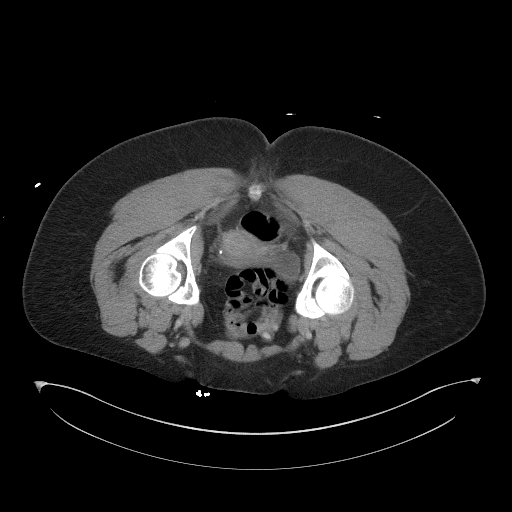
[im 61/121  soft-tissue]
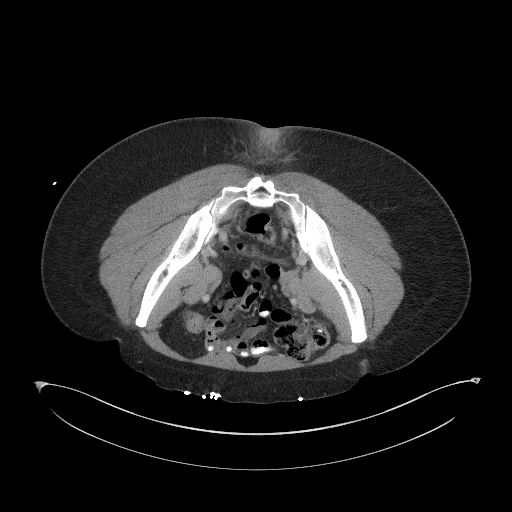
[im 71/121  soft-tissue]
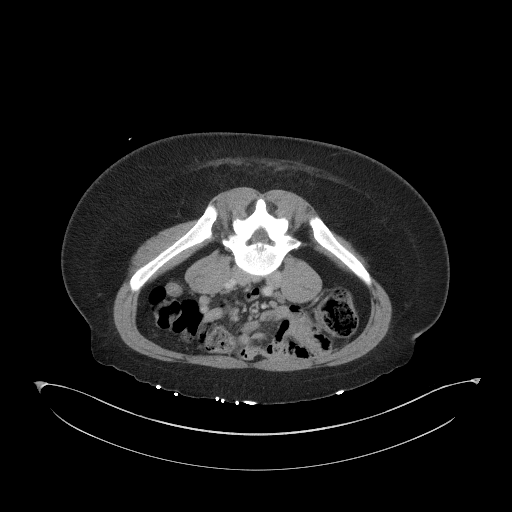
[im 81/121  soft-tissue]
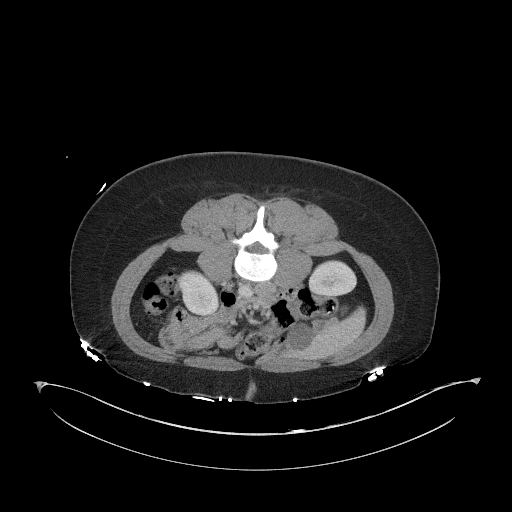
[im 91/121  soft-tissue]
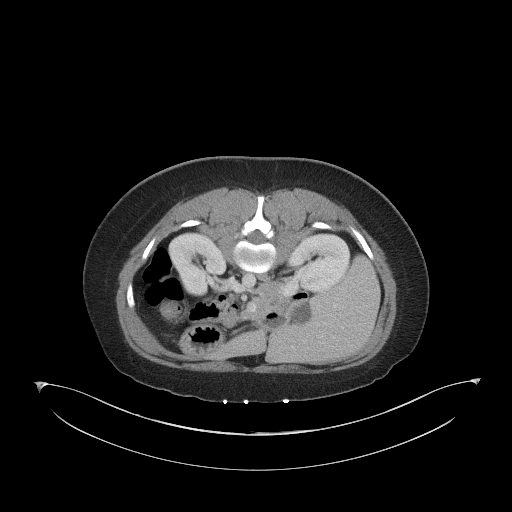
[im 91/121  bone]
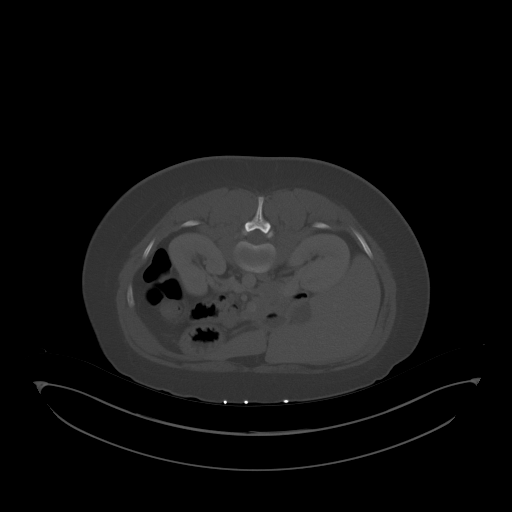
[im 101/121  soft-tissue]
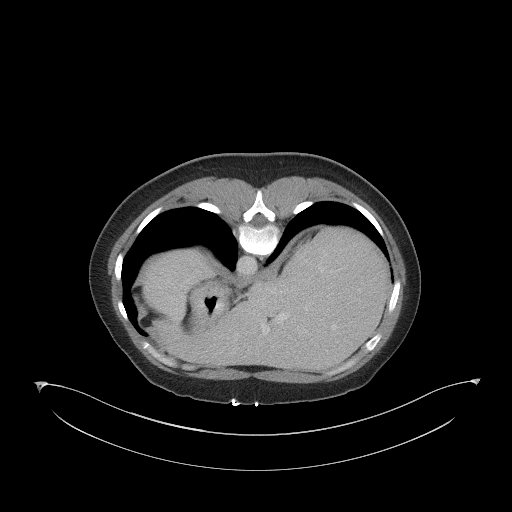
[im 111/121  soft-tissue]
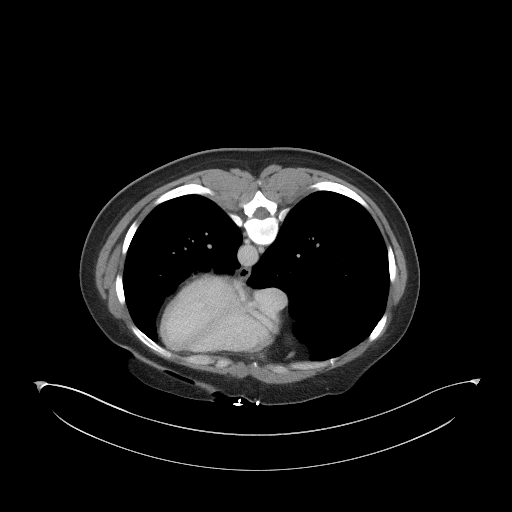

[Series 6: a/p w/ cor · coronal · 1.12mm/px · 3 of 178 slices shown]
[im 60/178  soft-tissue]
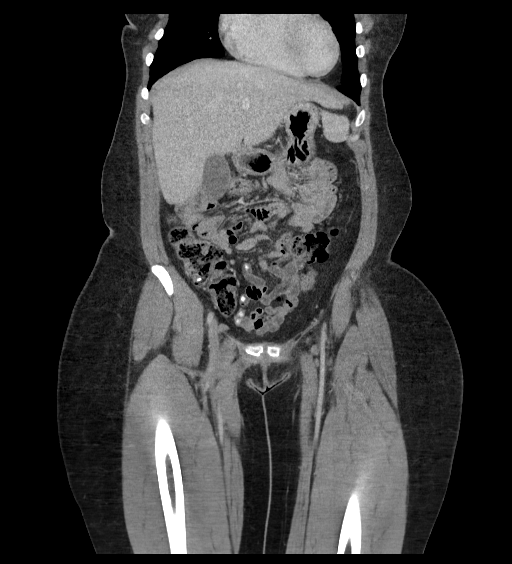
[im 79/178  soft-tissue]
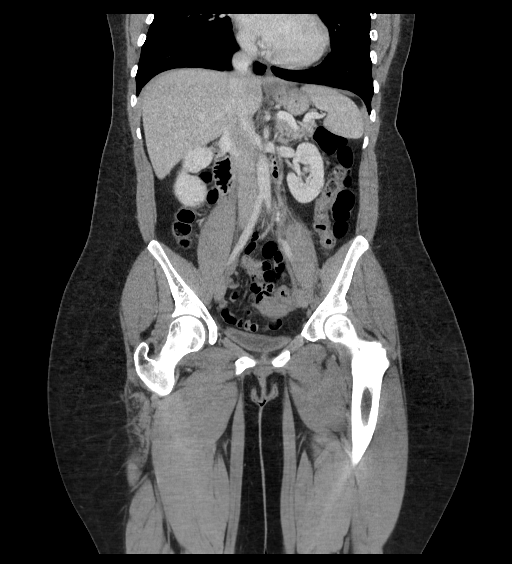
[im 99/178  soft-tissue]
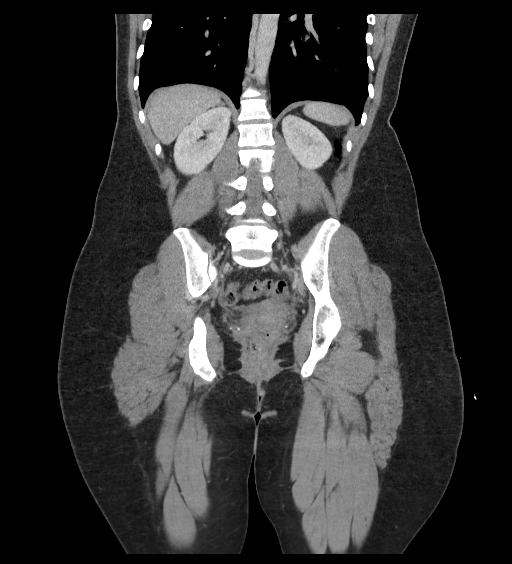

[14 of 46 positions shown; findings below may reference images not displayed]

FINDINGS: Lower chest: Unremarkable.

Hepatobiliary: No suspicious cystic or solid hepatic lesions. No
intra or extrahepatic biliary ductal dilatation. Gallbladder is
normal in appearance.

Pancreas: No pancreatic mass. No pancreatic ductal dilatation. No
pancreatic or peripancreatic fluid collections or inflammatory
changes.

Spleen: Unremarkable.

Adrenals/Urinary Tract: Bilateral kidneys and bilateral adrenal
glands are normal in appearance. No hydroureteronephrosis. Urinary
bladder is nearly decompressed, but otherwise unremarkable in
appearance.

Stomach/Bowel: The appearance of the stomach is normal. No
pathologic dilatation of small bowel or colon. Normal appendix.

Vascular/Lymphatic: No significant atherosclerotic disease, aneurysm
or dissection noted in the abdominal or pelvic vasculature. No
lymphadenopathy noted in the abdomen or pelvis.

Reproductive: No significant atherosclerotic disease, aneurysm or
dissection noted in the abdominal or pelvic vasculature. No
lymphadenopathy noted in the abdomen or pelvis.

Other: No significant volume of ascites.  No pneumoperitoneum.

Musculoskeletal: Extensive phlegmonous inflammation is noted in the
buttock region in near the midline where there is also a more
well-defined centrally low-attenuation peripherally rim enhancing
collection measuring approximately 2.2 x 2.2 x 2.8 cm (axial image
63 of series 3 and sagittal image 125 of series 7, concerning for a
small abscess. There are no aggressive appearing lytic or blastic
lesions noted in the visualized portions of the skeleton.
IMPRESSION: 1. Small abscess with surrounding phlegmonous inflammation in the
midline of the upper gluteal region, as above.
2. No other acute findings are noted elsewhere in the abdomen or
pelvis.

## 2022-02-17 ENCOUNTER — Other Ambulatory Visit: Payer: Self-pay

## 2022-02-17 ENCOUNTER — Encounter (HOSPITAL_COMMUNITY): Payer: Self-pay | Admitting: Emergency Medicine

## 2022-02-17 ENCOUNTER — Emergency Department (HOSPITAL_COMMUNITY)
Admission: EM | Admit: 2022-02-17 | Discharge: 2022-02-17 | Disposition: A | Discharge status: Home / Self Care | Payer: Medicaid Other | Attending: Emergency Medicine | Admitting: Emergency Medicine

## 2022-02-17 DIAGNOSIS — K029 Dental caries, unspecified: Secondary | ICD-10-CM | POA: Diagnosis not present

## 2022-02-17 DIAGNOSIS — K0889 Other specified disorders of teeth and supporting structures: Secondary | ICD-10-CM

## 2022-02-17 DIAGNOSIS — Z91040 Latex allergy status: Secondary | ICD-10-CM | POA: Diagnosis not present

## 2022-02-17 DIAGNOSIS — F419 Anxiety disorder, unspecified: Secondary | ICD-10-CM | POA: Diagnosis not present

## 2022-02-17 MED ORDER — ONDANSETRON HCL 4 MG PO TABS: 4.0000 mg | ORAL_TABLET | Freq: Four times a day (QID) | ORAL | 0 refills | Status: AC

## 2022-02-17 MED ORDER — CLINDAMYCIN HCL 300 MG PO CAPS: 300.0000 mg | ORAL_CAPSULE | Freq: Three times a day (TID) | ORAL | 0 refills | Status: AC

## 2022-02-17 MED ORDER — CLINDAMYCIN HCL 300 MG PO CAPS
300.0000 mg | ORAL_CAPSULE | Freq: Once | ORAL | Status: AC
  Administered 2022-02-17: 300 mg via ORAL
  Filled 2022-02-17: qty 1

## 2022-02-17 MED ORDER — KETOROLAC TROMETHAMINE 15 MG/ML IJ SOLN
15.0000 mg | Freq: Once | INTRAMUSCULAR | Status: AC
  Administered 2022-02-17: 15 mg via INTRAMUSCULAR
  Filled 2022-02-17: qty 1

## 2022-02-17 MED ORDER — LORAZEPAM 2 MG/ML IJ SOLN
1.0000 mg | Freq: Once | INTRAMUSCULAR | Status: AC
  Administered 2022-02-17: 1 mg via INTRAMUSCULAR
  Filled 2022-02-17: qty 1

## 2022-02-17 NOTE — Discharge Instructions (Signed)
Return for any problem.  ?

## 2022-02-17 NOTE — ED Notes (Signed)
Patient vomited up the Clindamycin capsule

## 2022-02-17 NOTE — ED Provider Notes (Signed)
[facility] Provider Note   CSN: [id number] Arrival date & time: 02/17/22  1023     History  Chief Complaint  Patient presents with   Dental Pain    Christine Morton is a 37 y.o. female.  37 year old female with prior medical history as detailed below presents for evaluation.  Patient with significant prior history of dental infections and pain.  Patient reports right-sided upper dental pain x 1 week.  Patient reports that she took some of her sister's leftover Keflex.  This did not help her symptoms.  She has not seen a dentist in quite some time.  Patient is visibly uncomfortable and anxious upon initial evaluation.  The history is provided by the patient and medical records.       Home Medications Prior to Admission medications   Medication Sig Start Date End Date Taking? Authorizing Provider  ALPRAZolam Duanne Moron) 0.5 MG tablet Take 0.5 mg by mouth daily as needed for anxiety. 11/28/19   [provider]  clindamycin (CLEOCIN) 300 MG capsule Take 1 capsule (300 mg total) by mouth 3 (three) times daily. 08/23/21   Azucena Cecil, PA-C  HYDROcodone-acetaminophen (NORCO/VICODIN) 5-325 MG tablet Take 2 tablets by mouth every 4 (four) hours as needed. 08/23/21   Azucena Cecil, PA-C  lidocaine (XYLOCAINE) 2 % solution Use as directed 15 mLs in the mouth or throat as needed for mouth pain. 08/23/21   Azucena Cecil, PA-C      Allergies    Bee venom, Other, Penicillins, Shellfish allergy, and Latex    Review of Systems   Review of Systems  All other systems reviewed and are negative.   Physical Exam Updated Vital Signs BP (!) 145/116   Pulse (!) 105   Temp 98.4 F (36.9 C) (Oral)   Resp 20   Ht 5' 1"$  (1.549 m)   Wt 86.2 kg   LMP 02/03/2022   SpO2 99%   BMI 35.90 kg/m  Physical Exam Vitals and nursing note reviewed.  Constitutional:      General: She is not in acute distress.    Appearance:  Normal appearance. She is well-developed.  HENT:     Head: Normocephalic and atraumatic.     Mouth/Throat:     Comments: Extensive diffuse dental decay.  Patient localizes pain primarily to the right upper jaw.  No clear evidence of dental abscess apparent on exam. Eyes:     Conjunctiva/sclera: Conjunctivae normal.     Pupils: Pupils are equal, round, and reactive to light.  Cardiovascular:     Rate and Rhythm: Normal rate and regular rhythm.     Heart sounds: Normal heart sounds.  Pulmonary:     Effort: Pulmonary effort is normal. No respiratory distress.     Breath sounds: Normal breath sounds.  Abdominal:     General: There is no distension.     Palpations: Abdomen is soft.     Tenderness: There is no abdominal tenderness.  Musculoskeletal:        General: No deformity. Normal range of motion.     Cervical back: Normal range of motion and neck supple.  Skin:    General: Skin is warm and dry.  Neurological:     General: No focal deficit present.     Mental Status: She is alert and oriented to person, place, and time.     ED Results / Procedures / Treatments   Labs (all labs ordered are listed, but  only abnormal results are displayed) Labs Reviewed - No data to display  EKG None  Radiology No results found.  Procedures Procedures    Medications Ordered in ED Medications  LORazepam (ATIVAN) injection 1 mg (has no administration in time range)  clindamycin (CLEOCIN) capsule 300 mg (300 mg Oral Given 02/17/22 1110)  ketorolac (TORADOL) 15 MG/ML injection 15 mg (15 mg Intramuscular Given 02/17/22 1111)    ED Course/ Medical Decision Making/ A&P                             Medical Decision Making Risk Prescription drug management.    Medical Screen Complete  This patient presented to the ED with complaint of dental pain/decay.  This complaint involves an extensive number of treatment options. The initial differential diagnosis includes, but is not  limited to, dental infection  This presentation is: Acute, Chronic, Self-Limited, Previously Undiagnosed, and Uncertain Prognosis  Patient with advanced dental decay presents with complaint of dental pain.  Patient is visibly anxious regarding his symptoms.  Patient is nontoxic in appearance.  Patient clearly would benefit from taking the appropriate antibiotic in the outpatient setting.  Patient is advised a close dental follow-up is indicated as well.  Importance of close follow-up is stressed.  Strict return precautions given and understood.  Additional history obtained:  External records from outside sources obtained and reviewed including prior ED visits and prior Inpatient records.    Medicines ordered:  I ordered medication including Toradol, Ativan, clindamycin for pain, anxiety, dental infection Reevaluation of the patient after these medicines showed that the patient: improved   Problem List / ED Course:  Dental pain, dental infection   Reevaluation:  After the interventions noted above, I reevaluated the patient and found that they have: improved  Disposition:  After consideration of the diagnostic results and the patients response to treatment, I feel that the patent would benefit from close outpatient followup.          Final Clinical Impression(s) / ED Diagnoses Final diagnoses:  Pain, dental    Rx / DC Orders ED Discharge Orders          Ordered    clindamycin (CLEOCIN) 300 MG capsule  3 times daily        02/17/22 1259    ondansetron (ZOFRAN) 4 MG tablet  Every 6 hours        02/17/22 1259              Valarie Merino, MD 02/17/22 1352

## 2022-02-17 NOTE — ED Triage Notes (Signed)
Patient arrives by POV c/o right sided facial swelling, otalgia and dental pain x 1 week. Patient states she has a whole in her tooth and it is very painful. Patient crying in triage.

## 2022-04-08 IMAGING — CR DG FINGER MIDDLE 2+V*R*
3 series · 3 of 3 positions shown · non-contrast
Comparison: None.

CLINICAL DATA: Dog bite middle finger

EXAM:
RIGHT MIDDLE FINGER 2+V

[x finger pa right]
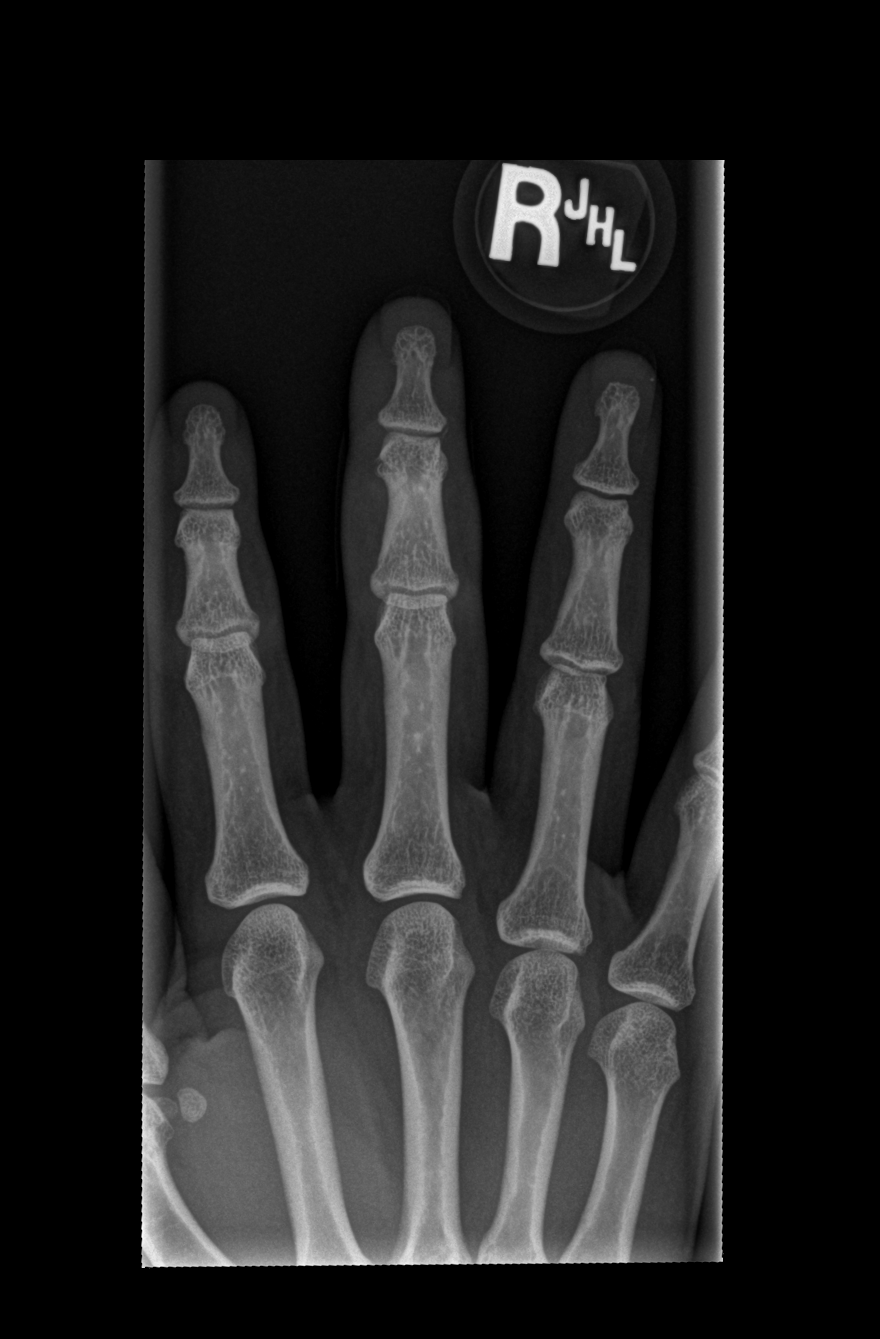

[x finger obl right]
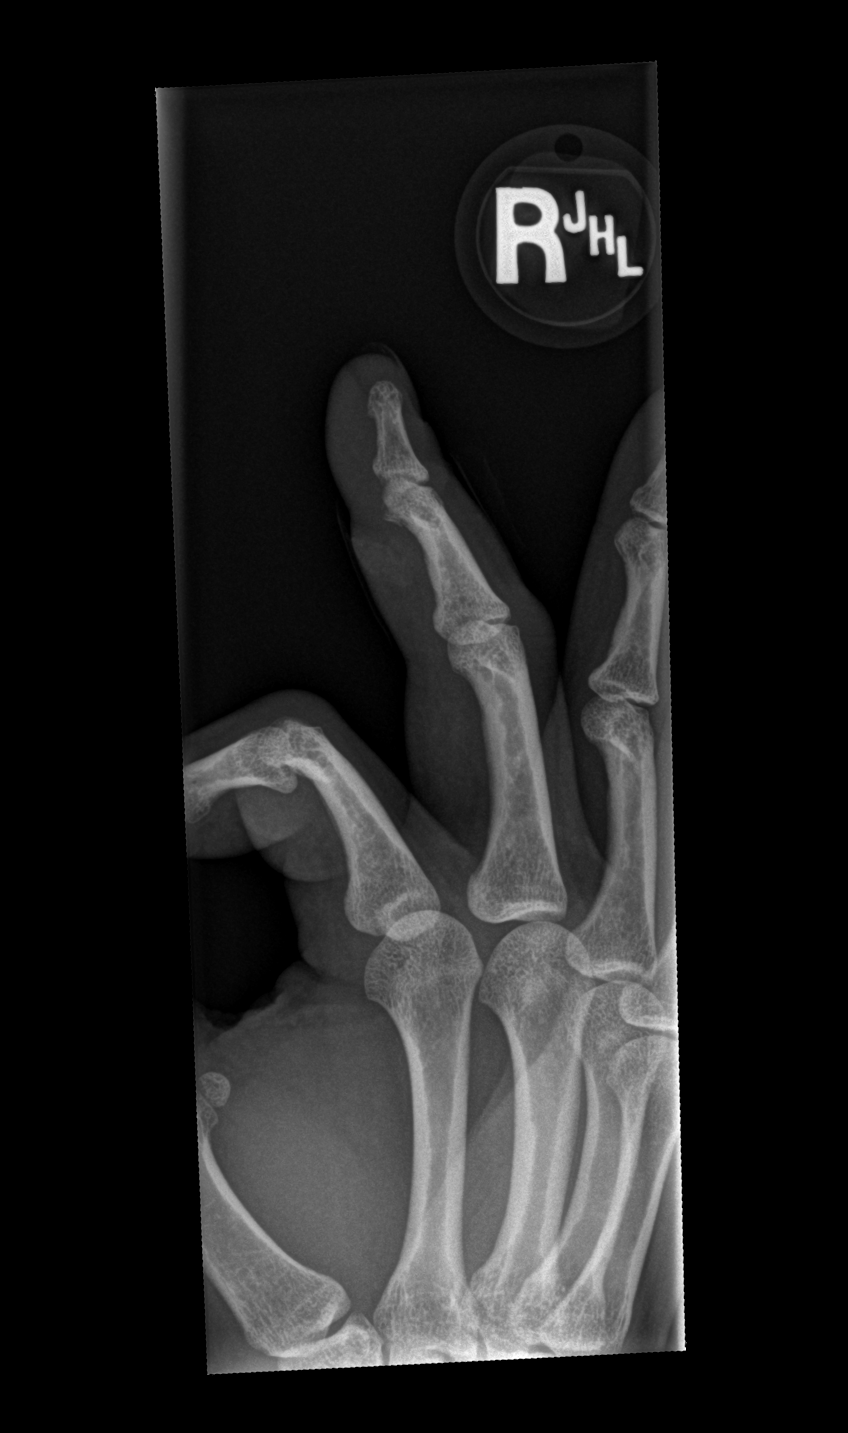

[x finger lat right]
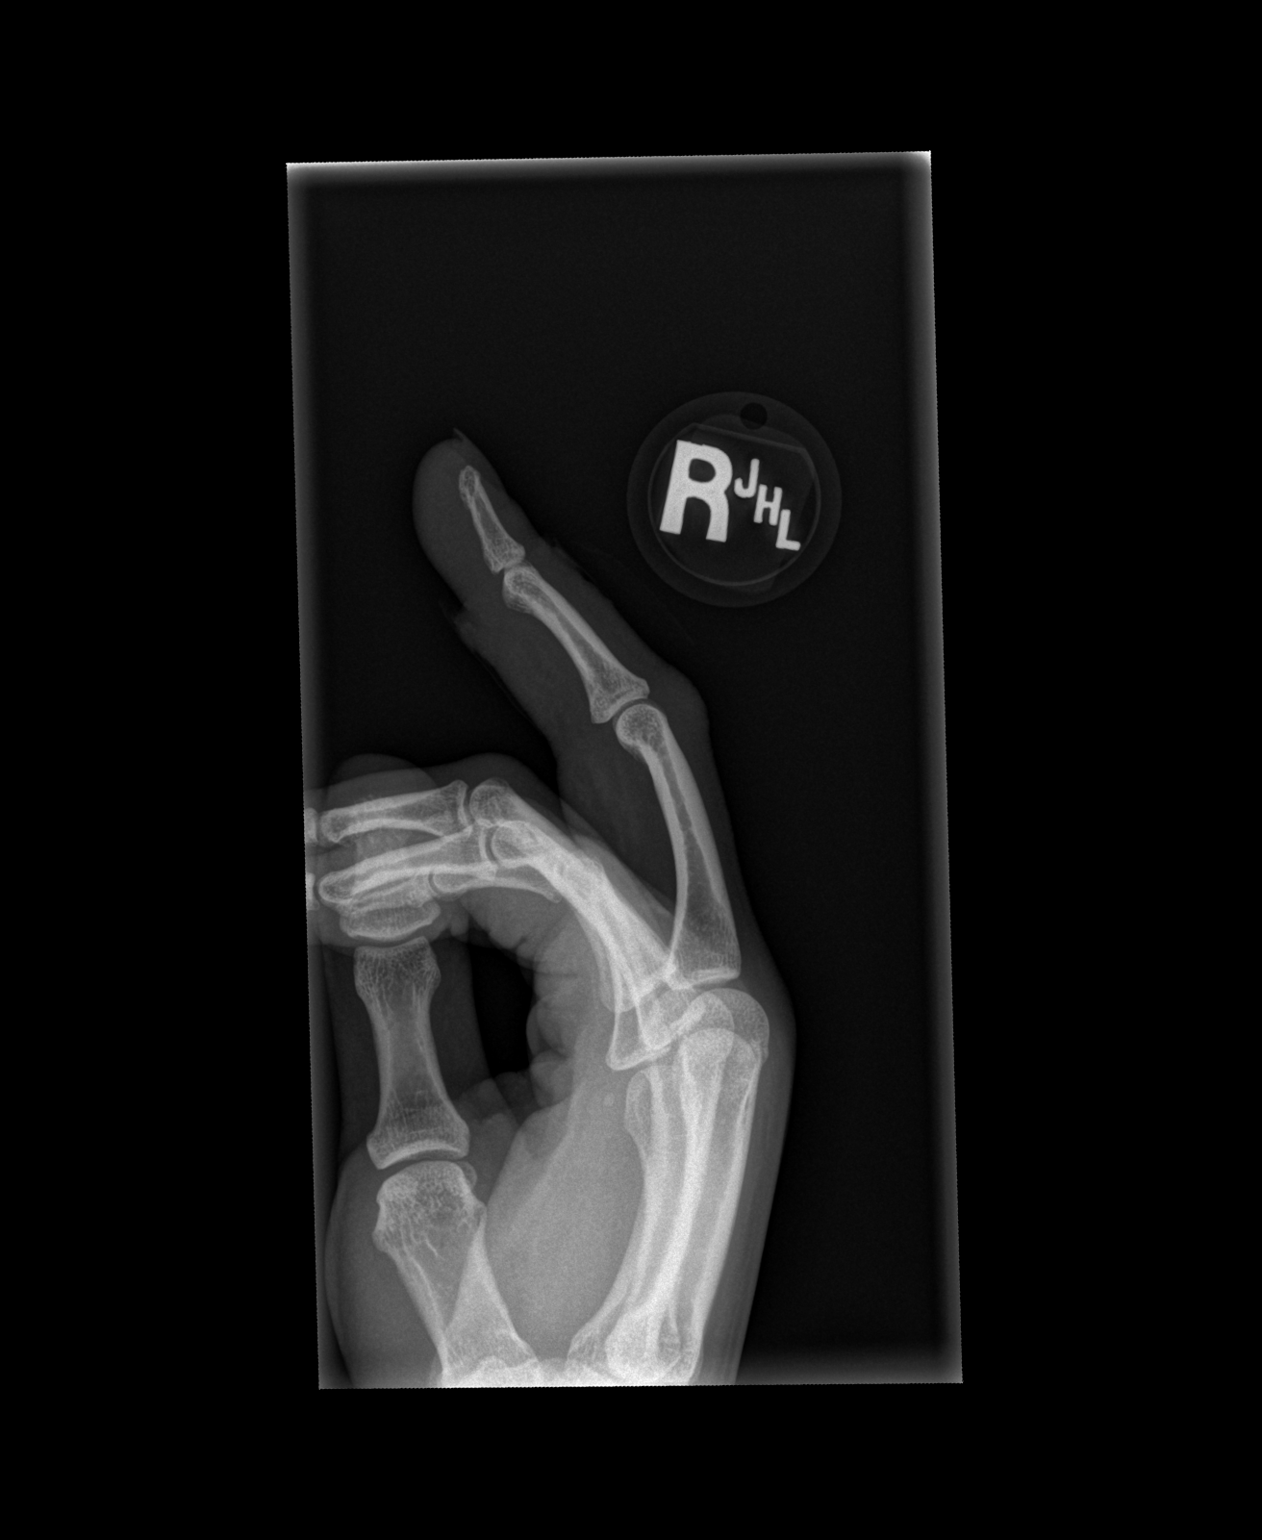

[3 of 3 positions shown; findings below may reference images not displayed]

FINDINGS: Fracture noted through the distal aspect of the right middle finger
middle phalanx. No subluxation or dislocation. No radiopaque foreign
bodies. Soft tissue swelling noted.
IMPRESSION: Fracture through the distal aspect of the right middle finger middle
phalanx.

## 2022-05-12 ENCOUNTER — Other Ambulatory Visit: Payer: Self-pay

## 2022-05-12 ENCOUNTER — Emergency Department (HOSPITAL_COMMUNITY)
Admission: EM | Admit: 2022-05-12 | Discharge: 2022-05-12 | Disposition: A | Discharge status: Home / Self Care | Payer: Medicaid Other | Attending: Emergency Medicine | Admitting: Emergency Medicine

## 2022-05-12 ENCOUNTER — Emergency Department (HOSPITAL_COMMUNITY): Payer: Medicaid Other

## 2022-05-12 DIAGNOSIS — D649 Anemia, unspecified: Secondary | ICD-10-CM | POA: Insufficient documentation

## 2022-05-12 DIAGNOSIS — R600 Localized edema: Secondary | ICD-10-CM

## 2022-05-12 DIAGNOSIS — T7840XA Allergy, unspecified, initial encounter: Secondary | ICD-10-CM | POA: Diagnosis not present

## 2022-05-12 DIAGNOSIS — K0889 Other specified disorders of teeth and supporting structures: Secondary | ICD-10-CM | POA: Insufficient documentation

## 2022-05-12 DIAGNOSIS — R220 Localized swelling, mass and lump, head: Secondary | ICD-10-CM | POA: Diagnosis present

## 2022-05-12 DIAGNOSIS — Z91040 Latex allergy status: Secondary | ICD-10-CM | POA: Diagnosis not present

## 2022-05-12 LAB — BASIC METABOLIC PANEL
Anion gap: 6 (ref 5–15)
BUN: <5 mg/dL — ABNORMAL LOW (ref 6–20)
CO2: 27 mmol/L (ref 22–32)
Calcium: 8.5 mg/dL — ABNORMAL LOW (ref 8.9–10.3)
Chloride: 106 mmol/L (ref 98–111)
Creatinine, Ser: 0.81 mg/dL (ref 0.44–1.00)
GFR, Estimated: >60 mL/min (ref >60)
Glucose, Bld: 101 mg/dL — ABNORMAL HIGH (ref 70–99)
Potassium: 3.5 mmol/L (ref 3.5–5.1)
Sodium: 139 mmol/L (ref 135–145)

## 2022-05-12 LAB — I-STAT BETA HCG BLOOD, ED (MC, WL, AP ONLY): I-stat hCG, quantitative: <5 m[IU]/mL (ref <5)

## 2022-05-12 LAB — CBC
HCT: 35.7 % — ABNORMAL LOW (ref 36.0–46.0)
Hemoglobin: 11.4 g/dL — ABNORMAL LOW (ref 12.0–15.0)
MCH: 28.9 pg (ref 26.0–34.0)
MCHC: 31.9 g/dL (ref 30.0–36.0)
MCV: 90.4 fL (ref 80.0–100.0)
Platelets: 326 10*3/uL (ref 150–400)
RBC: 3.95 MIL/uL (ref 3.87–5.11)
RDW: 11.8 % (ref 11.5–15.5)
WBC: 4.5 10*3/uL (ref 4.0–10.5)
nRBC: 0 % (ref 0.0–0.2)

## 2022-05-12 MED ORDER — KETOROLAC TROMETHAMINE 15 MG/ML IJ SOLN
15.0000 mg | Freq: Once | INTRAMUSCULAR | Status: AC
  Administered 2022-05-12: 15 mg via INTRAVENOUS
  Filled 2022-05-12: qty 1

## 2022-05-12 MED ORDER — DIPHENHYDRAMINE HCL 50 MG/ML IJ SOLN
25.0000 mg | Freq: Once | INTRAMUSCULAR | Status: AC
  Administered 2022-05-12: 25 mg via INTRAVENOUS
  Filled 2022-05-12: qty 1

## 2022-05-12 MED ORDER — OXYCODONE HCL 5 MG PO TABS: 5.0000 mg | ORAL_TABLET | ORAL | 0 refills | Status: AC | PRN

## 2022-05-12 MED ORDER — SODIUM CHLORIDE (PF) 0.9 % IJ SOLN
INTRAMUSCULAR | Status: AC
  Filled 2022-05-12: qty 50

## 2022-05-12 MED ORDER — DIPHENHYDRAMINE HCL 25 MG PO TABS: 25.0000 mg | ORAL_TABLET | Freq: Four times a day (QID) | ORAL | 0 refills | Status: AC | PRN

## 2022-05-12 MED ORDER — FAMOTIDINE 20 MG PO TABS: 20.0000 mg | ORAL_TABLET | Freq: Every day | ORAL | 0 refills | Status: AC

## 2022-05-12 MED ORDER — CLINDAMYCIN HCL 300 MG PO CAPS: 300.0000 mg | ORAL_CAPSULE | Freq: Three times a day (TID) | ORAL | 0 refills | Status: AC

## 2022-05-12 MED ORDER — IOHEXOL 300 MG/ML  SOLN
100.0000 mL | Freq: Once | INTRAMUSCULAR | Status: AC | PRN
  Administered 2022-05-12: 75 mL via INTRAVENOUS

## 2022-05-12 MED ORDER — CLINDAMYCIN PHOSPHATE 600 MG/50ML IV SOLN
600.0000 mg | Freq: Once | INTRAVENOUS | Status: AC
  Administered 2022-05-12: 600 mg via INTRAVENOUS
  Filled 2022-05-12: qty 50

## 2022-05-12 MED ORDER — OXYCODONE-ACETAMINOPHEN 5-325 MG PO TABS
1.0000 | ORAL_TABLET | Freq: Once | ORAL | Status: AC
  Administered 2022-05-12: 1 via ORAL
  Filled 2022-05-12: qty 1

## 2022-05-12 MED ORDER — DEXAMETHASONE SODIUM PHOSPHATE 10 MG/ML IJ SOLN
10.0000 mg | Freq: Once | INTRAMUSCULAR | Status: AC
  Administered 2022-05-12: 10 mg via INTRAVENOUS
  Filled 2022-05-12: qty 1

## 2022-05-12 MED ORDER — FAMOTIDINE IN NACL 20-0.9 MG/50ML-% IV SOLN
20.0000 mg | Freq: Once | INTRAVENOUS | Status: AC
  Administered 2022-05-12: 20 mg via INTRAVENOUS
  Filled 2022-05-12: qty 50

## 2022-05-12 MED ORDER — PREDNISONE 20 MG PO TABS: 40.0000 mg | ORAL_TABLET | Freq: Every day | ORAL | 0 refills | Status: AC

## 2022-05-12 MED ORDER — LORAZEPAM 1 MG PO TABS
1.0000 mg | ORAL_TABLET | Freq: Once | ORAL | Status: AC
  Administered 2022-05-12: 1 mg via ORAL
  Filled 2022-05-12: qty 1

## 2022-05-12 NOTE — ED Notes (Signed)
Patient transported to CT 

## 2022-05-12 NOTE — ED Provider Notes (Signed)
Care assumed from previous provider, see note for full HPI  In summation 37 year old here for evaluation of left-sided facial edema over the last 4 days with associated dental pain. CT scan shows possible cellulitis no drainable fluid collection.  Have extensive dental caries  Currently had possible allergic reaction after CT contrast dye.  Plan to observe and discharge Physical Exam  BP (!) 141/95 (BP Location: Right Arm)   Pulse 98   Temp 98 F (36.7 C) (Oral)   Resp 18   Ht 5\' 1"  (1.549 m)   Wt 81.6 kg   SpO2 98%   BMI 34.01 kg/m   Physical Exam Vitals and nursing note reviewed.  Constitutional:      General: She is not in acute distress.    Appearance: She is well-developed. She is not ill-appearing.  HENT:     Head: Atraumatic.  Eyes:     Pupils: Pupils are equal, round, and reactive to light.  Cardiovascular:     Rate and Rhythm: Normal rate.  Pulmonary:     Effort: No respiratory distress.  Abdominal:     General: There is no distension.  Musculoskeletal:        General: Normal range of motion.     Cervical back: Normal range of motion.  Skin:    General: Skin is warm and dry.  Neurological:     General: No focal deficit present.     Mental Status: She is alert.  Psychiatric:        Mood and Affect: Mood normal.     Procedures  Procedures Labs Reviewed  CBC - Abnormal; Notable for the following components:      Result Value   Hemoglobin 11.4 (*)    HCT 35.7 (*)    All other components within normal limits  BASIC METABOLIC PANEL - Abnormal; Notable for the following components:   Glucose, Bld 101 (*)    BUN <5 (*)    Calcium 8.5 (*)    All other components within normal limits  I-STAT BETA HCG BLOOD, ED ([facility], [facility], [facility] ONLY)   CT Maxillofacial W Contrast  Result Date: 05/12/2022 CLINICAL DATA:  Sublingual/submandibular abscess EXAM: CT MAXILLOFACIAL WITH CONTRAST TECHNIQUE: Multidetector CT imaging of the maxillofacial structures was performed with  intravenous contrast. Multiplanar CT image reconstructions were also generated. RADIATION DOSE REDUCTION: This exam was performed according to the departmental dose-optimization program which includes automated exposure control, adjustment of the mA and/or kV according to patient size and/or use of iterative reconstruction technique. CONTRAST:  75mL OMNIPAQUE IOHEXOL 300 MG/ML  SOLN COMPARISON:  CT face 08/23/2021 FINDINGS: Osseous: No acute fracture. There are multiple large dental caries and periapical lucencies involving the residual dentition along the maxillary arch bilaterally. Large periapical lucencies are also present along the bilateral mandibular premolars and left mandibular canine. Orbits: Negative. No traumatic or inflammatory finding. Sinuses: No mastoid or middle ear effusion. Paranasal sinuses are notable for polypoid mucosal thickening of the floor of bilateral maxillary sinuses. Orbits are unremarkable. Soft tissues: There is very mild asymmetric subcutaneous soft tissue stranding in the in the soft tissues overlying the left mandible (series 3, image 39). There is no evidence of a drainable soft tissue collection. Limited intracranial: No significant or unexpected finding. IMPRESSION: 1. Very mild asymmetric subcutaneous soft tissue stranding in the soft tissues overlying the left mandible. Findings could represent cellulitis. No evidence of a drainable soft tissue collection. 2. Extensive odontogenic disease with multiple large dental caries and periapical lucencies  involving the residual dentition along the maxillary arch bilaterally. Electronically Signed   By: Lorenza Cambridge M.D.   On: 05/12/2022 15:12    ED Course / MDM   Clinical Course as of 05/12/22 1720  Wed May 12, 2022  1400 Called to room by RN due to edema to feet and hands.  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 Does not have any knowledge of being in contact with any of these. IV pepcid and Benadryl  given.  [CA]    Clinical Course User Index [CA] Mannie Stabile, PA-C   Care assumed from previous provider, see note for full HPI  Summation 37 year old hide come in for oral swelling and dental pain.  She had a CT scan which showed possible cellulitis, no drainable fluid collection as well as multiple dental caries  No evidence of Ludwig angina, deep space infection, anaphylaxis  Patient had possible allergic reaction after IV contrast dye according to previous provider.  Plan to reassess.  Will treat for section, antibiotics sent  Patient reassessed.  She is neurovascularly intact.  No airway compromise.  Will have her follow-up outpatient, she follows with [facility]  Return for new or worsening symptoms.  Medical Decision Making Amount and/or Complexity of Data Reviewed External Data Reviewed: labs, radiology and notes. Labs: ordered. Decision-making details documented in ED Course. Radiology: ordered and independent interpretation performed. Decision-making details documented in ED Course.  Risk OTC drugs. Prescription drug management. Parenteral controlled substances. Decision regarding hospitalization. Diagnosis or treatment significantly limited by social determinants of health.          Raji Glinski A, PA-C 05/12/22 1720    Melene Plan, DO 05/12/22 1751

## 2022-05-12 NOTE — ED Triage Notes (Signed)
Pt arrived via POV. Pt c/o dental pain and swelling on L side for 4x days. Pt reports fever of 102 at home that they managed w/tylenol

## 2022-05-12 NOTE — ED Provider Notes (Signed)
[place] [facility]PART[facility] AT [facility] Provider Note   CSN: [id number] Arrival date & time: 05/12/22  1041     History  Chief Complaint  Patient presents with   Dental Pain   Oral Swelling    Christine Morton is a 37 y.o. female with a past medical history significant for anxiety who presents to the ED due to left-sided facial edema x 4 days.  Patient admits to upper and lower dental pain.  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  She admits to a fever for the past 2 days.  Tmax 102 F.  Admits to trismus, changes to phonation, and some difficulties breathing.  Also endorses some difficulty swallowing.   History obtained from patient and past medical records. No interpreter used during encounter.       Home Medications Prior to Admission medications   Medication Sig Start Date End Date Taking? Authorizing Provider  clindamycin (CLEOCIN) 300 MG capsule Take 1 capsule (300 mg total) by mouth 3 (three) times daily for 7 days. 05/12/22 05/19/22 Yes Zandra Lajeunesse, Merla Riches, PA-C  diphenhydrAMINE (BENADRYL) 25 MG tablet Take 1 tablet (25 mg total) by mouth every 6 (six) hours as needed. 05/12/22  Yes Rodrigo Mcgranahan, Merla Riches, PA-C  famotidine (PEPCID) 20 MG tablet Take 1 tablet (20 mg total) by mouth daily for 5 days. 05/12/22 05/17/22 Yes Trevar Boehringer, Merla Riches, PA-C  predniSONE (DELTASONE) 20 MG tablet Take 2 tablets (40 mg total) by mouth daily for 5 days. 05/12/22 05/17/22 Yes Damaris Abeln, Merla Riches, PA-C  ALPRAZolam Prudy Feeler) 0.5 MG tablet Take 0.5 mg by mouth daily as needed for anxiety. 11/28/19   [provider]  clindamycin (CLEOCIN) 300 MG capsule Take 1 capsule (300 mg total) by mouth 3 (three) times daily. 08/23/21   Al Decant, PA-C  HYDROcodone-acetaminophen (NORCO/VICODIN) 5-325 MG tablet Take 2 tablets by mouth every 4 (four) hours as needed. 08/23/21   Al Decant, PA-C  lidocaine (XYLOCAINE) 2 % solution Use  as directed 15 mLs in the mouth or throat as needed for mouth pain. 08/23/21   Al Decant, PA-C  ondansetron (ZOFRAN) 4 MG tablet Take 1 tablet (4 mg total) by mouth every 6 (six) hours. 02/17/22   Wynetta Fines, MD      Allergies    Bee venom, Other, Penicillins, Shellfish allergy, and Latex    Review of Systems   Review of Systems  Constitutional:  Positive for fever.  HENT:  Positive for dental problem, trouble swallowing and voice change.   Respiratory:  Positive for shortness of breath.     Physical Exam Updated Vital Signs BP (!) 152/98 (BP Location: Right Arm)   Pulse (!) 110   Temp 98 F (36.7 C) (Oral)   Resp 18   Ht 5\' 1"  (1.549 m)   Wt 81.6 kg   SpO2 98%   BMI 34.01 kg/m  Physical Exam Vitals and nursing note reviewed.  Constitutional:      General: She is not in acute distress.    Appearance: She is not ill-appearing.  HENT:     Head: Normocephalic.     Mouth/Throat:     Comments: 1 finger trismus. Poor dentition throughout with numerous missing teeth. Unable to fully evaluate due to trismus. Left sided facial edema from below left eye to jawline.  Tolerating oral secretions without difficulty. Eyes:     Pupils: Pupils are equal, round, and reactive to  light.  Cardiovascular:     Rate and Rhythm: Normal rate and regular rhythm.     Pulses: Normal pulses.     Heart sounds: Normal heart sounds. No murmur heard.    No friction rub. No gallop.  Pulmonary:     Effort: Pulmonary effort is normal.     Breath sounds: Normal breath sounds.     Comments: No stridor or wheeze. Abdominal:     General: Abdomen is flat. There is no distension.     Palpations: Abdomen is soft.     Tenderness: There is no abdominal tenderness. There is no guarding or rebound.  Musculoskeletal:        General: Normal range of motion.     Cervical back: Neck supple.  Skin:    General: Skin is warm and dry.  Neurological:     General: No focal deficit present.     Mental  Status: She is alert.  Psychiatric:        Mood and Affect: Mood normal.        Behavior: Behavior normal.     ED Results / Procedures / Treatments   Labs (all labs ordered are listed, but only abnormal results are displayed) Labs Reviewed  CBC - Abnormal; Notable for the following components:      Result Value   Hemoglobin 11.4 (*)    HCT 35.7 (*)    All other components within normal limits  BASIC METABOLIC PANEL - Abnormal; Notable for the following components:   Glucose, Bld 101 (*)    BUN <5 (*)    Calcium 8.5 (*)    All other components within normal limits  I-STAT BETA HCG BLOOD, ED ([facility], [facility], [facility] ONLY)    EKG None  Radiology CT Maxillofacial W Contrast  Result Date: 05/12/2022 CLINICAL DATA:  Sublingual/submandibular abscess EXAM: CT MAXILLOFACIAL WITH CONTRAST TECHNIQUE: Multidetector CT imaging of the maxillofacial structures was performed with intravenous contrast. Multiplanar CT image reconstructions were also generated. RADIATION DOSE REDUCTION: This exam was performed according to the departmental dose-optimization program which includes automated exposure control, adjustment of the mA and/or kV according to patient size and/or use of iterative reconstruction technique. CONTRAST:  75mL OMNIPAQUE IOHEXOL 300 MG/ML  SOLN COMPARISON:  CT face 08/23/2021 FINDINGS: Osseous: No acute fracture. There are multiple large dental caries and periapical lucencies involving the residual dentition along the maxillary arch bilaterally. Large periapical lucencies are also present along the bilateral mandibular premolars and left mandibular canine. Orbits: Negative. No traumatic or inflammatory finding. Sinuses: No mastoid or middle ear effusion. Paranasal sinuses are notable for polypoid mucosal thickening of the floor of bilateral maxillary sinuses. Orbits are unremarkable. Soft tissues: There is very mild asymmetric subcutaneous soft tissue stranding in the in the soft tissues overlying the  left mandible (series 3, image 39). There is no evidence of a drainable soft tissue collection. Limited intracranial: No significant or unexpected finding. IMPRESSION: 1. Very mild asymmetric subcutaneous soft tissue stranding in the soft tissues overlying the left mandible. Findings could represent cellulitis. No evidence of a drainable soft tissue collection. 2. Extensive odontogenic disease with multiple large dental caries and periapical lucencies involving the residual dentition along the maxillary arch bilaterally. Electronically Signed   By: Lorenza Cambridge M.D.   On: 05/12/2022 15:12    Procedures Procedures    Medications Ordered in ED Medications  dexamethasone (DECADRON) injection 10 mg (10 mg Intravenous Given 05/12/22 1222)  ketorolac (TORADOL) 15 MG/ML injection 15 mg (15  mg Intravenous Given 05/12/22 1221)  clindamycin (CLEOCIN) IVPB 600 mg (0 mg Intravenous Stopped 05/12/22 1417)  iohexol (OMNIPAQUE) 300 MG/ML solution 100 mL (75 mLs Intravenous Contrast Given 05/12/22 1444)  LORazepam (ATIVAN) tablet 1 mg (1 mg Oral Given 05/12/22 1343)  famotidine (PEPCID) IVPB 20 mg premix (20 mg Intravenous New Bag/Given 05/12/22 1424)  diphenhydrAMINE (BENADRYL) injection 25 mg (25 mg Intravenous Given 05/12/22 1425)    ED Course/ Medical Decision Making/ A&P Clinical Course as of 05/12/22 1518  Wed May 12, 2022  1400 Called to room by RN due to edema to feet and hands.  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 Does not have any knowledge of being in contact with any of these. IV pepcid and Benadryl given.  [CA]    Clinical Course User Index [CA] Mannie Stabile, PA-C                             Medical Decision Making Amount and/or Complexity of Data Reviewed Labs: ordered. Decision-making details documented in ED Course. Radiology: ordered and independent interpretation performed. Decision-making details documented in ED Course.  Risk OTC drugs. Prescription drug  management.   This patient presents to the ED for concern of facial edema, this involves an extensive number of treatment options, and is a complaint that carries with it a high risk of complications and morbidity.  The differential diagnosis includes abscess, infection, allergic reaction, etc  37 year old female presents to the ED due to dental pain and facial edema for the past 4 days.  Also admits to fever, trismus, and difficulty swallowing.  Upon arrival, patient borderline febrile at 99.2 F.  Patient with 1 finger trismus so unable to fully evaluate mouth.  Poor dentition with numerous missing teeth.  Facial edema from below the left eye to jawline.  Tolerating oral secretions without difficulty.  No stridor or wheeze.  No signs of respiratory distress. Decadron and Toradol given. CT maxillofacial to rule out abscess. IV clindamycin given. Discussed with Dr. Denton Lank who agrees with assessment and plan.   CBC reassuring.  No leukocytosis.  Mild anemia with hemoglobin 11.4.  BMP reassuring.  Pregnancy test negative.  Called to bedside due to possible allergic reaction.  See note above.  No evidence of anaphylaxis.  Pepcid and Benadryl given.  Patient handed off to [facility], PA-C at shift change pending CT maxillofacial and observation for allergic reaction.        Final Clinical Impression(s) / ED Diagnoses Final diagnoses:  Facial edema  Allergic reaction, initial encounter    Rx / DC Orders ED Discharge Orders          Ordered    famotidine (PEPCID) 20 MG tablet  Daily        05/12/22 1513    diphenhydrAMINE (BENADRYL) 25 MG tablet  Every 6 hours PRN        05/12/22 1513    predniSONE (DELTASONE) 20 MG tablet  Daily        05/12/22 1513    clindamycin (CLEOCIN) 300 MG capsule  3 times daily        05/12/22 1513              Mannie Stabile, PA-C 05/12/22 1518    Cathren Laine, MD 05/13/22 1625

## 2022-05-12 NOTE — Discharge Instructions (Signed)
Follow-up outpatient. ° °Return for new or worsening symptoms. °

## 2022-09-05 DEATH — deceased
